# Patient Record
Sex: Female | Born: 1970 | Race: White | Hispanic: No | Marital: Married | Smoking: Never smoker
Health system: Southern US, Community
[De-identification: ages and names within clinical notes are randomized; demographics above are authoritative.]

## PROBLEM LIST (undated history)

## (undated) ENCOUNTER — Inpatient Hospital Stay (HOSPITAL_COMMUNITY): Payer: Self-pay

## (undated) DIAGNOSIS — D649 Anemia, unspecified: Secondary | ICD-10-CM

## (undated) DIAGNOSIS — F419 Anxiety disorder, unspecified: Secondary | ICD-10-CM

## (undated) DIAGNOSIS — R011 Cardiac murmur, unspecified: Secondary | ICD-10-CM

## (undated) HISTORY — PX: EXCISION OF BREAST BIOPSY: SHX5822

## (undated) HISTORY — DX: Anemia, unspecified: D64.9

## (undated) HISTORY — PX: BREAST EXCISIONAL BIOPSY: SUR124

---

## 2010-06-07 ENCOUNTER — Encounter: Admission: RE | Admit: 2010-06-07 | Discharge: 2010-06-07 | Payer: Self-pay | Admitting: Obstetrics and Gynecology

## 2011-01-20 ENCOUNTER — Ambulatory Visit (HOSPITAL_BASED_OUTPATIENT_CLINIC_OR_DEPARTMENT_OTHER)
Admission: RE | Admit: 2011-01-20 | Discharge: 2011-01-20 | Disposition: A | Discharge status: Home / Self Care | Payer: BC Managed Care – PPO | Source: Ambulatory Visit | Attending: Orthopedic Surgery | Admitting: Orthopedic Surgery

## 2011-01-20 DIAGNOSIS — T65891A Toxic effect of other specified substances, accidental (unintentional), initial encounter: Secondary | ICD-10-CM | POA: Insufficient documentation

## 2011-01-20 DIAGNOSIS — S61209A Unspecified open wound of unspecified finger without damage to nail, initial encounter: Secondary | ICD-10-CM | POA: Insufficient documentation

## 2011-01-20 DIAGNOSIS — W268XXA Contact with other sharp object(s), not elsewhere classified, initial encounter: Secondary | ICD-10-CM | POA: Insufficient documentation

## 2011-01-20 DIAGNOSIS — M65839 Other synovitis and tenosynovitis, unspecified forearm: Secondary | ICD-10-CM | POA: Insufficient documentation

## 2011-01-23 LAB — WOUND CULTURE

## 2011-01-24 NOTE — Op Note (Signed)
Lisa Carter, Lisa Carter            ACCOUNT NO.:  [id number]  MEDICAL RECORD NO.:  [id number]           PATIENT TYPE:  LOCATION:                                 FACILITY:  PHYSICIAN:  Katy Fitch. Janene Yousuf, M.D.      DATE OF BIRTH:  DATE OF PROCEDURE:  01/20/2011 DATE OF DISCHARGE:                              OPERATIVE REPORT   PREOPERATIVE DIAGNOSIS:  One week status post thorn impalement, left index finger distal interphalangeal flexion crease volar surface with development of inflammation and inability to flex left index finger without discomfort.  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  OPERATIONS: 1. Incision and debridement of left index finger flexor sheath and     distal interphalangeal flexion crease region. 2. Culture of flexor sheath tenosynovium fluid for aerobic and     anaerobic growth. 3. Placement of a Xeroflo wick drain at level of flexor sheath, left     index finger.  OPERATING SURGEON:  Katy Fitch. Lisa Leisner, MD  ASSISTANT:  Marveen Reeks. Dasnoit, PA-C  ANESTHESIA:  2% lidocaine metacarpal head level block, left index finger, total volume 3 mL.  This was performed as a minor operating room procedure.  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  While pushing back a grassy bush, she sustained some type of impalement of a thorn into the palmar surface of her left index finger at the DIP flexion crease.  This appeared to be a minor injury; however, over the course of the ensuing 7 days, she began to experience increasing rubor at her DIP flexion crease, discomfort with finger flexion and by today,  the inability to flex the finger beyond 85% of normal.  On the right side, she could flex her fingertip to the middle palmar crease.  On the left side, she lacked contact by the index finger to the distal palmar crease by 15 mm.  She was nontender on palpation over the flexor sheath proximally in the palm and over the proximal phalangeal segment, but was tender over the middle phalangeal segment and in the pulp.  She appeared to have maximum tenderness on the radial aspect of her DIP flexion crease and in the proximal pulp.  We were unable to clearly identified the type of bush she was exposed to; however, one is concerned that this could be a Pyracantha thorn, which can cause significant chemical inflammation or this could be the lead edge of a bacterial inflammatory/infectious tenosynovitis.  Lisa Carter contacted our office and requested evaluation.  We met her the [facility] and noted the aforementioned findings of loss of flexion and tenderness over the tenosynovial sheath.  We recommended exploration of her fingertip under local anesthesia.  She had had breakfast.  PROCEDURE:  Lisa Carter was brought to room #1 of the [facility] and placed in supine  position on the operating table.  She was noted have no drug allergies.  After informed consent in the holding area and with questions being invited and answered, the left hand was prepped with alcohol and Betadine and 3 mL of 2% lidocaine without epinephrine were infiltrated at metacarpal head level to obtain a digital block.  After 10 minutes, excellent anesthesia of the left index finger was achieved.  We subsequently prepped her left hand and forearm with Betadine soap solution and sterilely draped.  The left index finger was exsanguinated with a gauze wrap and a 0.5-inch Penrose drain was placed at the base of the finger as a digital tourniquet.  After routine surgical time-out, we performed a  Brunner zigzag incision with a longitudinal distal limb and a 45 degrees angled limb into the middle phalangeal segment of the finger.  The subcutaneous tissues were quite indurated with saponification of the subcutaneous fat.  We meticulously spread this region and noted a bulging flexor sheath.  The flexor sheath was incised with the tenotomy scissors and immediately approximately 1 mL of turbid fluid was expressed.  This represents either an early bacterial tenosynovitis or a chemical synovitis from a toxic thorn.  With great care, we spread the pulp and the radial ulnar aspects of the middle phalangeal segment of the finger. I was able to identify two fragments of thorn that were quite small suggesting a toxic response.  It appeared that we identified the tip of the thorn with its pointed end and a second fragment that appeared to a broken off from the main fragment.  After meticulous search, we could not identify any other fragments.  The wound was then irrigated with sterile saline with a syringe under pressure followed by packing the wound with Xeroflo wick and applying Xeroflo sterile gauze and the Coban finger dressing.  There were no apparent complications.  While there was a slight risk of an atypical mycobacterial infection, more likely not this represents a toxic thorn reaction.  Ciclaly will be covered with oral doxycycline 100 mg p.o. b.i.d. for broad-spectrum coverage of Gram-positive bacteria and partial coverage of atypical mycobacteria.  If she has persistent inflammation, we may need to switch to Biaxin or one of the other agents that are more appropriate for atypical mycobacteria.  She was provided Ultram 50 mg one p.o. q.4-6 h. p.r.n. pain 20 tablets as an analgesic and doxycycline 100 mg p.o. b.i.d. x7 days as a prophylactic antibiotic.  We will see her back for follow up in our office on Monday, January 23, 2011 for a followup evaluation.  Both she and her  husband Dr. Emelia Loron understand that I am on-call this weekend and will be happy to reevaluate her any time sooner p.r.n. problems.     Katy Fitch Lisa Carter, M.D.     RVS/MEDQ  D:  01/20/2011  T:  01/21/2011  Job:  [id number]  cc:   Juanetta Gosling, MD  Electronically Signed by Josephine Igo M.D. on 01/24/2011 02:21:15 PM

## 2011-06-07 ENCOUNTER — Other Ambulatory Visit: Payer: Self-pay | Admitting: Obstetrics and Gynecology

## 2011-06-07 DIAGNOSIS — Z1231 Encounter for screening mammogram for malignant neoplasm of breast: Secondary | ICD-10-CM

## 2011-06-07 DIAGNOSIS — R2230 Localized swelling, mass and lump, unspecified upper limb: Secondary | ICD-10-CM

## 2011-06-14 ENCOUNTER — Ambulatory Visit
Admission: RE | Admit: 2011-06-14 | Discharge: 2011-06-14 | Disposition: A | Discharge status: Home / Self Care | Payer: BC Managed Care – PPO | Source: Ambulatory Visit | Attending: Obstetrics and Gynecology | Admitting: Obstetrics and Gynecology

## 2011-06-14 DIAGNOSIS — R2230 Localized swelling, mass and lump, unspecified upper limb: Secondary | ICD-10-CM

## 2011-06-16 ENCOUNTER — Ambulatory Visit: Payer: BC Managed Care – PPO

## 2012-05-13 ENCOUNTER — Other Ambulatory Visit: Payer: Self-pay | Admitting: Obstetrics and Gynecology

## 2012-05-13 DIAGNOSIS — Z1231 Encounter for screening mammogram for malignant neoplasm of breast: Secondary | ICD-10-CM

## 2012-06-14 ENCOUNTER — Ambulatory Visit: Payer: BC Managed Care – PPO

## 2012-06-26 ENCOUNTER — Institutional Professional Consult (permissible substitution): Payer: BC Managed Care – PPO | Admitting: Cardiovascular Disease

## 2012-07-15 ENCOUNTER — Ambulatory Visit
Admission: RE | Admit: 2012-07-15 | Discharge: 2012-07-15 | Disposition: A | Discharge status: Home / Self Care | Payer: BC Managed Care – PPO | Source: Ambulatory Visit | Attending: Obstetrics and Gynecology | Admitting: Obstetrics and Gynecology

## 2012-07-15 DIAGNOSIS — Z1231 Encounter for screening mammogram for malignant neoplasm of breast: Secondary | ICD-10-CM

## 2013-06-24 ENCOUNTER — Inpatient Hospital Stay (HOSPITAL_COMMUNITY): Payer: BC Managed Care – PPO

## 2013-06-24 ENCOUNTER — Inpatient Hospital Stay (HOSPITAL_COMMUNITY)
Admission: AD | Admit: 2013-06-24 | Discharge: 2013-06-24 | Disposition: A | Discharge status: Home / Self Care | Payer: BC Managed Care – PPO | Source: Ambulatory Visit | Attending: Obstetrics & Gynecology | Admitting: Obstetrics & Gynecology

## 2013-06-24 ENCOUNTER — Encounter (HOSPITAL_COMMUNITY): Payer: Self-pay

## 2013-06-24 DIAGNOSIS — O34599 Maternal care for other abnormalities of gravid uterus, unspecified trimester: Secondary | ICD-10-CM

## 2013-06-24 DIAGNOSIS — O200 Threatened abortion: Secondary | ICD-10-CM

## 2013-06-24 DIAGNOSIS — R109 Unspecified abdominal pain: Secondary | ICD-10-CM | POA: Insufficient documentation

## 2013-06-24 DIAGNOSIS — N831 Corpus luteum cyst of ovary, unspecified side: Secondary | ICD-10-CM | POA: Insufficient documentation

## 2013-06-24 HISTORY — DX: Cardiac murmur, unspecified: R01.1

## 2013-06-24 HISTORY — DX: Anxiety disorder, unspecified: F41.9

## 2013-06-24 LAB — URINALYSIS, ROUTINE W REFLEX MICROSCOPIC
Bilirubin Urine: NEGATIVE
Glucose, UA: NEGATIVE mg/dL
Hgb urine dipstick: NEGATIVE
Ketones, ur: NEGATIVE mg/dL
Leukocytes, UA: NEGATIVE
Nitrite: NEGATIVE
Protein, ur: NEGATIVE mg/dL
Specific Gravity, Urine: <1.005 — ABNORMAL LOW (ref 1.005–1.030)
pH: 5.5 (ref 5.0–8.0)

## 2013-06-24 LAB — CBC
RBC: 3.51 MIL/uL — ABNORMAL LOW (ref 3.87–5.11)
WBC: 6.7 10*3/uL (ref 4.0–10.5)

## 2013-06-24 LAB — WET PREP, GENITAL

## 2013-06-24 LAB — HCG, QUANTITATIVE, PREGNANCY: hCG, Beta Chain, Quant, S: 716 m[IU]/mL — ABNORMAL HIGH (ref <5)

## 2013-06-24 NOTE — MAU Provider Note (Signed)
Chief Complaint: Vaginal Bleeding, Abdominal Cramping and Possible Pregnancy   First Provider Initiated Contact with Patient 06/24/13 2114     SUBJECTIVE HPI: Lisa Carter is a 42 y.o. G3P2002 at [redacted]w[redacted]d by LMP who presents with low abdominal cramping left greater than right x3 days and one episode of moderate red bleeding yesterday. Was due to start her period, but took UPT because of atypical bleeding and cramping. UPT positive. Has been taking oral contraceptives, but stopped 2 to flashes. The morning after pill approximately 2 days after ovulation. Denies fever, chills, passage of clots or tissue, nausea, vomiting, diarrhea, constipation, vaginal discharge, urinary complaints. Has not had any other testing this pregnancy.  Past Medical History  Diagnosis Date  . Heart murmur   . Anxiety     taking celexa   OB History  Gravida Para Term Preterm AB SAB TAB Ectopic Multiple Living  3 2 2       2     # Outcome Date GA Lbr Len/2nd Weight Sex Delivery Anes PTL Lv  3 CUR           2 TRM 07/04/05 [redacted]w[redacted]d    LTCS   Y     Comments: c.section d/t reconstructive surgery after 1st delivery  1 TRM 09/14/02 [redacted]w[redacted]d    SVD   Y     Past Surgical History  Procedure Laterality Date  . Cesarean section    . Breast lumpectomy     History   Social History  . Marital Status: Married    Spouse Name: N/A    Number of Children: N/A  . Years of Education: N/A   Occupational History  . Not on file.   Social History Main Topics  . Smoking status: Never Smoker   . Smokeless tobacco: Not on file  . Alcohol Use: Yes     Comment: socially  . Drug Use: No  . Sexual Activity: Yes    Birth Control/ Protection: None   Other Topics Concern  . Not on file   Social History Narrative  . No narrative on file   No current facility-administered medications on file prior to encounter.   No current outpatient prescriptions on file prior to encounter.   No Known Allergies  ROS: Pertinent items in  HPI  OBJECTIVE Blood pressure 122/74, pulse 85, temperature 99.2 F (37.3 C), temperature source Oral, resp. rate 18, height 5\' 2"  (1.575 m), weight 56.246 kg (124 lb), last menstrual period 05/24/2013. GENERAL: Well-developed, well-nourished female in no acute distress. Anxious. HEENT: Normocephalic HEART: normal rate RESP: normal effort ABDOMEN: Soft, non-tender. Mild diffuse tenderness. Negative rebound negative mass positive bowel sounds. EXTREMITIES: Nontender, no edema NEURO: Alert and oriented SPECULUM EXAM: NEFG, physiologic discharge, scant, tan blood noted, 1 cm ectropion. cervix by clean BIMANUAL: cervix close; uterus normal size, mild left adnexal tenderness. No mass. No right adnexal tenderness or mass. No cervical motion tenderness.  LAB RESULTS Results for orders placed during the hospital encounter of 06/24/13 (from the past 24 hour(s))  URINALYSIS, ROUTINE W REFLEX MICROSCOPIC     Status: Abnormal   Collection Time    06/24/13  8:48 PM      Result Value Range   Color, Urine YELLOW  YELLOW   APPearance CLEAR  CLEAR   Specific Gravity, Urine <1.005 (*) 1.005 - 1.030   pH 5.5  5.0 - 8.0   Glucose, UA NEGATIVE  NEGATIVE mg/dL   Hgb urine dipstick NEGATIVE  NEGATIVE   Bilirubin Urine NEGATIVE  NEGATIVE   Ketones, ur NEGATIVE  NEGATIVE mg/dL   Protein, ur NEGATIVE  NEGATIVE mg/dL   Urobilinogen, UA 0.2  0.0 - 1.0 mg/dL   Nitrite NEGATIVE  NEGATIVE   Leukocytes, UA NEGATIVE  NEGATIVE  POCT PREGNANCY, URINE     Status: Abnormal   Collection Time    06/24/13  8:55 PM      Result Value Range   Preg Test, Ur POSITIVE (*) NEGATIVE  ABO/RH     Status: None   Collection Time    06/24/13  9:20 PM      Result Value Range   ABO/RH(D) B POS    HCG, QUANTITATIVE, PREGNANCY     Status: Abnormal   Collection Time    06/24/13  9:20 PM      Result Value Range   hCG, Beta Chain, Quant, S 716 (*) <5 mIU/mL  CBC     Status: Abnormal   Collection Time    06/24/13  9:20 PM       Result Value Range   WBC 6.7  4.0 - 10.5 K/uL   RBC 3.51 (*) 3.87 - 5.11 MIL/uL   Hemoglobin 11.7 (*) 12.0 - 15.0 g/dL   HCT 45.4 (*) 09.8 - 11.9 %   MCV 96.6  78.0 - 100.0 fL   MCH 33.3  26.0 - 34.0 pg   MCHC 34.5  30.0 - 36.0 g/dL   RDW 14.7  82.9 - 56.2 %   Platelets 202  150 - 400 K/uL  WET PREP, GENITAL     Status: Abnormal   Collection Time    06/24/13 10:46 PM      Result Value Range   Yeast Wet Prep HPF POC NONE SEEN  NONE SEEN   Trich, Wet Prep NONE SEEN  NONE SEEN   Clue Cells Wet Prep HPF POC NONE SEEN  NONE SEEN   WBC, Wet Prep HPF POC MODERATE (*) NONE SEEN    IMAGING US Ob Comp Less 14 Wks  06/24/2013   *RADIOLOGY REPORT*  Clinical Data: Vaginal bleeding and cramping for 3 days.  Estimated gestational age by LMP is 4 weeks 3 days.  Quantitative beta HCG is 716.  OBSTETRIC <14 WK [place] AND TRANSVAGINAL OB US  Technique:  Both transabdominal and transvaginal ultrasound examinations were performed for complete evaluation of the gestation as well as the maternal uterus, adnexal regions, and pelvic cul-de-sac.  Transvaginal technique was performed to assess early pregnancy.  Comparison:  None.  Intrauterine gestational sac:  Not visualized Yolk sac: Not visualized Embryo: Not visualized Cardiac Activity: Not visualized  Maternal uterus/adnexae: The uterus is anteverted.  No myometrial masses.  Small Nabothian cysts in the cervix.  Endometrial stripe thickness is normal.  Both ovaries are visualized are symmetrical in appearance.  Flow is demonstrated in both ovaries on color flow Doppler imaging.  The left ovary measures 2.8 x 3.4 x 2.5 cm.  The right ovary measures 1.3 x 2.2 x 1.4 cm.  Suggestion of a small corpus luteum cyst on the left ovary.  No free pelvic fluid collections.  IMPRESSION: No intrauterine pregnancy is demonstrated. This could represent an early intrauterine pregnancy which is too small to see versus changes of recent spontaneous abortion.  An occult ectopic pregnancy  is not excluded.  Recommend follow-up with serial quantitative beta HCG studies and short-term ultrasound followup in 10 days.   Original Report Authenticated By: Burman Nieves, M.D.   US Ob Transvaginal  06/24/2013   *RADIOLOGY  REPORT*  Clinical Data: Vaginal bleeding and cramping for 3 days.  Estimated gestational age by LMP is 4 weeks 3 days.  Quantitative beta HCG is 716.  OBSTETRIC <14 WK [place] AND TRANSVAGINAL OB US  Technique:  Both transabdominal and transvaginal ultrasound examinations were performed for complete evaluation of the gestation as well as the maternal uterus, adnexal regions, and pelvic cul-de-sac.  Transvaginal technique was performed to assess early pregnancy.  Comparison:  None.  Intrauterine gestational sac:  Not visualized Yolk sac: Not visualized Embryo: Not visualized Cardiac Activity: Not visualized  Maternal uterus/adnexae: The uterus is anteverted.  No myometrial masses.  Small Nabothian cysts in the cervix.  Endometrial stripe thickness is normal.  Both ovaries are visualized are symmetrical in appearance.  Flow is demonstrated in both ovaries on color flow Doppler imaging.  The left ovary measures 2.8 x 3.4 x 2.5 cm.  The right ovary measures 1.3 x 2.2 x 1.4 cm.  Suggestion of a small corpus luteum cyst on the left ovary.  No free pelvic fluid collections.  IMPRESSION: No intrauterine pregnancy is demonstrated. This could represent an early intrauterine pregnancy which is too small to see versus changes of recent spontaneous abortion.  An occult ectopic pregnancy is not excluded.  Recommend follow-up with serial quantitative beta HCG studies and short-term ultrasound followup in 10 days.   Original Report Authenticated By: Burman Nieves, M.D.   MAU COURSE  ASSESSMENT 1. Threatened miscarriage in early pregnancy    2. left corpus luteum cyst.  PLAN Discharge home in stable condition per consult with Dr. Langston Masker. Ectopic and SAB precautions reviewed.     Follow-up  Information   Follow up with [facility], [place].. Schedule an appointment as soon as possible for a visit on 06/27/2013.   Contact information:   [address] [place] [address] [phone]      Follow up with [facility] [place] [facility] In 2 days. (for repeat blood work or sooner as needed if symptoms worsen)    Contact information:   [address] [id number] [place] [address] [phone]       Medication List         ALEVE 220 MG tablet  Generic drug:  naproxen sodium  Take 220 mg by mouth daily as needed (For headache.).     citalopram 20 MG tablet  Commonly known as:  CELEXA  Take 20 mg by mouth daily.     JUICE PLUS FIBRE PO  Take 4 capsules by mouth daily.       Dana, CNM 06/24/2013  11:06 PM

## 2013-06-24 NOTE — MAU Note (Signed)
Bright red bleeding & abdominal cramping x 3 days. States thought she was starting her period but had a positive UPT today at home.

## 2013-06-25 LAB — GC/CHLAMYDIA PROBE AMP: CT Probe RNA: NEGATIVE

## 2013-06-27 ENCOUNTER — Inpatient Hospital Stay (HOSPITAL_COMMUNITY): Payer: BC Managed Care – PPO

## 2013-06-27 ENCOUNTER — Encounter (HOSPITAL_COMMUNITY): Payer: Self-pay

## 2013-06-27 ENCOUNTER — Inpatient Hospital Stay (HOSPITAL_COMMUNITY)
Admission: AD | Admit: 2013-06-27 | Discharge: 2013-06-27 | Disposition: A | Discharge status: Home / Self Care | Payer: BC Managed Care – PPO | Source: Ambulatory Visit | Attending: Obstetrics and Gynecology | Admitting: Obstetrics and Gynecology

## 2013-06-27 DIAGNOSIS — R109 Unspecified abdominal pain: Secondary | ICD-10-CM

## 2013-06-27 DIAGNOSIS — O26899 Other specified pregnancy related conditions, unspecified trimester: Secondary | ICD-10-CM

## 2013-06-27 DIAGNOSIS — R1031 Right lower quadrant pain: Secondary | ICD-10-CM | POA: Insufficient documentation

## 2013-06-27 DIAGNOSIS — O99891 Other specified diseases and conditions complicating pregnancy: Secondary | ICD-10-CM | POA: Insufficient documentation

## 2013-06-27 NOTE — MAU Note (Addendum)
Pt reports having RLQ pain.Marland Kitchen Here on Tues with spotting. Not able to see anything on U/S. Pt stated the Right side is very tender to touch. No bleeding today. Reported her BHCG was 700 in MAU on Tuesday and 1800 at [facility] office.

## 2013-06-27 NOTE — MAU Provider Note (Signed)
History     CSN: [id number]  Arrival date and time: 06/27/13 1508   First Provider Initiated Contact with Patient 06/27/13 1845      Chief Complaint  Patient presents with  . Abdominal Pain   HPI Lisa Carter is 42 y.o. G3P2002 [redacted]w[redacted]d weeks presenting with left lower abdominal pain X 5 days. It became sharper today. Called the office and instructed to come here for re-evaluation.  Had contractions on Sunday and then on Monday, she had heavy bleeding.  She is a patient of Dr. Garlan Fillers.  She was seen here for same pain on 8/17.  BHCG was 716 and U/S did not see IUP.  She was followed in the office yesterday in the office.   States the BHCG was 1800.  24 hrs later she is here and BHCG is 2073.  She is very upset, crying.  This is an unplanned pregnancy.  She has pain med at home.   Past Medical History  Diagnosis Date  . Heart murmur   . Anxiety     taking celexa    Past Surgical History  Procedure Laterality Date  . Cesarean section    . Breast lumpectomy      History reviewed. No pertinent family history.  History  Substance Use Topics  . Smoking status: Never Smoker   . Smokeless tobacco: Not on file  . Alcohol Use: Yes     Comment: socially    Allergies: No Known Allergies  Prescriptions prior to admission  Medication Sig Dispense Refill  . citalopram (CELEXA) 20 MG tablet Take 20 mg by mouth daily.      . naproxen sodium (ALEVE) 220 MG tablet Take 220 mg by mouth daily as needed (For headache.).      Marland Kitchen Nutritional Supplements (JUICE PLUS FIBRE PO) Take 4 capsules by mouth daily.        Review of Systems  Constitutional: Negative for fever and chills.  Gastrointestinal: Positive for abdominal pain (right sided pain). Negative for nausea and vomiting.  Genitourinary:       Neg for vaginal bleeding or discharge   Physical Exam   Blood pressure 117/70, pulse 84, temperature 98.3 F (36.8 C), temperature source Oral, resp. rate 18, height 5\' 2"  (1.575 m),  weight 118 lb (53.524 kg), last menstrual period 05/24/2013.  Physical Exam  Constitutional: She is oriented to person, place, and time. She appears well-developed and well-nourished.  crying  Genitourinary:  Not indicated and patient prefers to defer exam  Neurological: She is alert and oriented to person, place, and time.  Skin: Skin is warm and dry.  Psychiatric: She has a normal mood and affect. Her behavior is normal.    Results for orders placed during the hospital encounter of 06/27/13 (from the past 24 hour(s))  HCG, QUANTITATIVE, PREGNANCY     Status: Abnormal   Collection Time    06/27/13  4:20 PM      Result Value Range   hCG, Beta Chain, Quant, S 2073 (*) <5 mIU/mL     MAU Course  Procedures  MDM 19:02- Reported MSE, labs to Dr. Arelia Sneddon.  Explained patient is not bleeding and she prefers not to have pelvic exam.  Ok with Dr. Arelia Sneddon.  Follow up with Dr. Garlan Fillers Monday   Assessment and Plan  A:  Right abdominal pain      Reassuring rise in BHCG  P:  Follow up with Dr. Renaldo Fiddler on Monday      Ectopic precautions given  to patient  Matt Holmes 06/27/2013, 6:47 PM

## 2013-07-21 LAB — OB RESULTS CONSOLE GC/CHLAMYDIA
Chlamydia: NEGATIVE
Gonorrhea: NEGATIVE

## 2013-07-21 LAB — OB RESULTS CONSOLE HEPATITIS B SURFACE ANTIGEN: HEP B S AG: NEGATIVE

## 2013-07-21 LAB — OB RESULTS CONSOLE RUBELLA ANTIBODY, IGM: RUBELLA: IMMUNE

## 2013-07-21 LAB — OB RESULTS CONSOLE HIV ANTIBODY (ROUTINE TESTING): HIV: NONREACTIVE

## 2013-12-03 ENCOUNTER — Ambulatory Visit: Payer: 59 | Attending: Obstetrics and Gynecology | Admitting: Physical Therapy

## 2013-12-03 DIAGNOSIS — M25559 Pain in unspecified hip: Secondary | ICD-10-CM | POA: Insufficient documentation

## 2013-12-03 DIAGNOSIS — IMO0001 Reserved for inherently not codable concepts without codable children: Secondary | ICD-10-CM | POA: Insufficient documentation

## 2013-12-03 DIAGNOSIS — R5381 Other malaise: Secondary | ICD-10-CM | POA: Insufficient documentation

## 2013-12-03 DIAGNOSIS — O9989 Other specified diseases and conditions complicating pregnancy, childbirth and the puerperium: Secondary | ICD-10-CM

## 2013-12-03 DIAGNOSIS — O99891 Other specified diseases and conditions complicating pregnancy: Secondary | ICD-10-CM | POA: Insufficient documentation

## 2013-12-09 ENCOUNTER — Ambulatory Visit: Payer: 59 | Attending: Obstetrics and Gynecology | Admitting: Physical Therapy

## 2013-12-09 DIAGNOSIS — IMO0001 Reserved for inherently not codable concepts without codable children: Secondary | ICD-10-CM | POA: Insufficient documentation

## 2013-12-09 DIAGNOSIS — M25559 Pain in unspecified hip: Secondary | ICD-10-CM | POA: Insufficient documentation

## 2013-12-09 DIAGNOSIS — O9989 Other specified diseases and conditions complicating pregnancy, childbirth and the puerperium: Secondary | ICD-10-CM

## 2013-12-09 DIAGNOSIS — O99891 Other specified diseases and conditions complicating pregnancy: Secondary | ICD-10-CM | POA: Insufficient documentation

## 2013-12-09 DIAGNOSIS — R5381 Other malaise: Secondary | ICD-10-CM | POA: Insufficient documentation

## 2013-12-12 ENCOUNTER — Ambulatory Visit: Payer: 59 | Admitting: Physical Therapy

## 2013-12-15 ENCOUNTER — Ambulatory Visit: Payer: 59 | Admitting: Physical Therapy

## 2013-12-17 ENCOUNTER — Ambulatory Visit: Payer: 59

## 2013-12-17 ENCOUNTER — Ambulatory Visit: Payer: 59 | Admitting: Physical Therapy

## 2013-12-23 ENCOUNTER — Ambulatory Visit: Payer: 59 | Admitting: Physical Therapy

## 2013-12-26 ENCOUNTER — Ambulatory Visit: Payer: 59 | Admitting: Physical Therapy

## 2013-12-29 ENCOUNTER — Ambulatory Visit: Payer: 59 | Admitting: Physical Therapy

## 2013-12-31 ENCOUNTER — Ambulatory Visit: Payer: 59 | Admitting: Physical Therapy

## 2014-01-06 ENCOUNTER — Ambulatory Visit: Payer: 59 | Attending: Obstetrics and Gynecology | Admitting: Physical Therapy

## 2014-01-06 DIAGNOSIS — R5381 Other malaise: Secondary | ICD-10-CM | POA: Insufficient documentation

## 2014-01-06 DIAGNOSIS — IMO0001 Reserved for inherently not codable concepts without codable children: Secondary | ICD-10-CM | POA: Insufficient documentation

## 2014-01-06 DIAGNOSIS — O9989 Other specified diseases and conditions complicating pregnancy, childbirth and the puerperium: Secondary | ICD-10-CM

## 2014-01-06 DIAGNOSIS — O99891 Other specified diseases and conditions complicating pregnancy: Secondary | ICD-10-CM | POA: Insufficient documentation

## 2014-01-06 DIAGNOSIS — M25559 Pain in unspecified hip: Secondary | ICD-10-CM | POA: Insufficient documentation

## 2014-01-09 ENCOUNTER — Ambulatory Visit: Payer: 59 | Admitting: Physical Therapy

## 2014-01-14 ENCOUNTER — Ambulatory Visit: Payer: 59 | Admitting: Physical Therapy

## 2014-01-15 ENCOUNTER — Ambulatory Visit: Payer: 59 | Admitting: Physical Therapy

## 2014-01-16 ENCOUNTER — Encounter: Payer: 59 | Admitting: Physical Therapy

## 2014-02-02 LAB — OB RESULTS CONSOLE GBS: STREP GROUP B AG: POSITIVE

## 2014-02-12 ENCOUNTER — Inpatient Hospital Stay (HOSPITAL_COMMUNITY)
Admission: AD | Admit: 2014-02-12 | Discharge: 2014-02-14 | DRG: 766 | Disposition: A | Discharge status: Home / Self Care | Payer: 59 | Source: Ambulatory Visit | Attending: Obstetrics and Gynecology | Admitting: Obstetrics and Gynecology

## 2014-02-12 ENCOUNTER — Encounter (HOSPITAL_COMMUNITY)
Admission: AD | Disposition: A | Discharge status: Home / Self Care | Payer: Self-pay | Source: Ambulatory Visit | Attending: Obstetrics and Gynecology

## 2014-02-12 ENCOUNTER — Inpatient Hospital Stay (HOSPITAL_COMMUNITY): Payer: 59 | Admitting: Anesthesiology

## 2014-02-12 ENCOUNTER — Encounter (HOSPITAL_COMMUNITY): Payer: 59 | Admitting: Anesthesiology

## 2014-02-12 ENCOUNTER — Encounter (HOSPITAL_COMMUNITY): Payer: Self-pay | Admitting: *Deleted

## 2014-02-12 DIAGNOSIS — Z302 Encounter for sterilization: Secondary | ICD-10-CM

## 2014-02-12 DIAGNOSIS — O99344 Other mental disorders complicating childbirth: Secondary | ICD-10-CM | POA: Diagnosis present

## 2014-02-12 DIAGNOSIS — F411 Generalized anxiety disorder: Secondary | ICD-10-CM | POA: Diagnosis present

## 2014-02-12 DIAGNOSIS — Z98891 History of uterine scar from previous surgery: Secondary | ICD-10-CM

## 2014-02-12 DIAGNOSIS — O34219 Maternal care for unspecified type scar from previous cesarean delivery: Principal | ICD-10-CM | POA: Diagnosis present

## 2014-02-12 DIAGNOSIS — R011 Cardiac murmur, unspecified: Secondary | ICD-10-CM | POA: Diagnosis present

## 2014-02-12 LAB — TYPE AND SCREEN
ABO/RH(D): B POS
ANTIBODY SCREEN: NEGATIVE

## 2014-02-12 LAB — CBC
HEMATOCRIT: 33.5 % — AB (ref 36.0–46.0)
HEMOGLOBIN: 11.7 g/dL — AB (ref 12.0–15.0)
MCH: 33.9 pg (ref 26.0–34.0)
MCHC: 34.9 g/dL (ref 30.0–36.0)
MCV: 97.1 fL (ref 78.0–100.0)
Platelets: 123 10*3/uL — ABNORMAL LOW (ref 150–400)
RBC: 3.45 MIL/uL — AB (ref 3.87–5.11)
RDW: 13.3 % (ref 11.5–15.5)
WBC: 7.1 10*3/uL (ref 4.0–10.5)

## 2014-02-12 LAB — AMNISURE RUPTURE OF MEMBRANE (ROM) NOT AT ARMC: AMNISURE: POSITIVE

## 2014-02-12 SURGERY — Surgical Case
Anesthesia: Spinal | Site: Abdomen

## 2014-02-12 MED ORDER — PROMETHAZINE HCL 25 MG/ML IJ SOLN: 6.2500 mg | INTRAMUSCULAR | Status: DC | PRN

## 2014-02-12 MED ORDER — FENTANYL CITRATE 0.05 MG/ML IJ SOLN
INTRAMUSCULAR | Status: DC | PRN
  Administered 2014-02-12: 10 ug via INTRATHECAL

## 2014-02-12 MED ORDER — PHENYLEPHRINE 8 MG IN D5W 100 ML (0.08MG/ML) PREMIX OPTIME
INJECTION | INTRAVENOUS | Status: AC
  Filled 2014-02-12: qty 100

## 2014-02-12 MED ORDER — PHENYLEPHRINE 8 MG IN D5W 100 ML (0.08MG/ML) PREMIX OPTIME
INJECTION | INTRAVENOUS | Status: DC | PRN
  Administered 2014-02-12: 60 ug/min via INTRAVENOUS

## 2014-02-12 MED ORDER — HYDROMORPHONE HCL PF 1 MG/ML IJ SOLN
0.2500 mg | INTRAMUSCULAR | Status: DC | PRN
  Administered 2014-02-12 (×4): 0.5 mg via INTRAVENOUS

## 2014-02-12 MED ORDER — MEPERIDINE HCL 25 MG/ML IJ SOLN
INTRAMUSCULAR | Status: DC | PRN
Start: 2014-02-12 — End: 2014-02-13
  Administered 2014-02-12 (×2): 12.5 mg via INTRAVENOUS

## 2014-02-12 MED ORDER — SCOPOLAMINE 1 MG/3DAYS TD PT72
MEDICATED_PATCH | TRANSDERMAL | Status: AC
Start: 2014-02-12 — End: 2014-02-13
  Filled 2014-02-12: qty 1

## 2014-02-12 MED ORDER — KETOROLAC TROMETHAMINE 30 MG/ML IJ SOLN
30.0000 mg | Freq: Four times a day (QID) | INTRAMUSCULAR | Status: AC | PRN
  Administered 2014-02-12 – 2014-02-13 (×2): 30 mg via INTRAVENOUS
  Filled 2014-02-12: qty 1

## 2014-02-12 MED ORDER — FENTANYL CITRATE 0.05 MG/ML IJ SOLN
INTRAMUSCULAR | Status: AC
  Filled 2014-02-12: qty 2

## 2014-02-12 MED ORDER — OXYTOCIN 10 UNIT/ML IJ SOLN
40.0000 [IU] | INTRAVENOUS | Status: DC | PRN
  Administered 2014-02-12: 40 [IU] via INTRAVENOUS

## 2014-02-12 MED ORDER — LACTATED RINGERS IV SOLN
INTRAVENOUS | Status: DC
  Administered 2014-02-12 (×3): via INTRAVENOUS

## 2014-02-12 MED ORDER — HYDROMORPHONE HCL PF 1 MG/ML IJ SOLN
INTRAMUSCULAR | Status: AC
  Filled 2014-02-12: qty 1

## 2014-02-12 MED ORDER — DIPHENHYDRAMINE HCL 50 MG/ML IJ SOLN
INTRAMUSCULAR | Status: AC
  Administered 2014-02-12: 12.5 mg
  Filled 2014-02-12: qty 1

## 2014-02-12 MED ORDER — MEPERIDINE HCL 25 MG/ML IJ SOLN
INTRAMUSCULAR | Status: AC
  Filled 2014-02-12: qty 1

## 2014-02-12 MED ORDER — MEPERIDINE HCL 25 MG/ML IJ SOLN: 6.2500 mg | INTRAMUSCULAR | Status: DC | PRN

## 2014-02-12 MED ORDER — FAMOTIDINE IN NACL 20-0.9 MG/50ML-% IV SOLN
20.0000 mg | Freq: Once | INTRAVENOUS | Status: AC
  Administered 2014-02-12: 20 mg via INTRAVENOUS
  Filled 2014-02-12: qty 50

## 2014-02-12 MED ORDER — SCOPOLAMINE 1 MG/3DAYS TD PT72
1.0000 | MEDICATED_PATCH | Freq: Once | TRANSDERMAL | Status: DC
  Administered 2014-02-12: 1.5 mg via TRANSDERMAL

## 2014-02-12 MED ORDER — NALBUPHINE HCL 10 MG/ML IJ SOLN
INTRAMUSCULAR | Status: AC
  Administered 2014-02-12: 10 mg via INTRAVENOUS
  Filled 2014-02-12: qty 1

## 2014-02-12 MED ORDER — KETOROLAC TROMETHAMINE 30 MG/ML IJ SOLN: 30.0000 mg | Freq: Four times a day (QID) | INTRAMUSCULAR | Status: AC | PRN

## 2014-02-12 MED ORDER — KETOROLAC TROMETHAMINE 30 MG/ML IJ SOLN
INTRAMUSCULAR | Status: AC
  Administered 2014-02-13: 30 mg via INTRAVENOUS
  Filled 2014-02-12: qty 1

## 2014-02-12 MED ORDER — ONDANSETRON HCL 4 MG/2ML IJ SOLN
INTRAMUSCULAR | Status: DC | PRN
  Administered 2014-02-12: 4 mg via INTRAVENOUS

## 2014-02-12 MED ORDER — MORPHINE SULFATE (PF) 0.5 MG/ML IJ SOLN
INTRAMUSCULAR | Status: DC | PRN
  Administered 2014-02-12: .2 mg via INTRATHECAL

## 2014-02-12 MED ORDER — OXYTOCIN 10 UNIT/ML IJ SOLN
INTRAMUSCULAR | Status: AC
  Filled 2014-02-12: qty 4

## 2014-02-12 MED ORDER — MORPHINE SULFATE 0.5 MG/ML IJ SOLN
INTRAMUSCULAR | Status: AC
  Filled 2014-02-12: qty 10

## 2014-02-12 MED ORDER — FENTANYL CITRATE 0.05 MG/ML IJ SOLN
50.0000 ug | INTRAMUSCULAR | Status: DC | PRN
  Administered 2014-02-12 – 2014-02-13 (×10): 50 ug via INTRAVENOUS

## 2014-02-12 MED ORDER — ONDANSETRON HCL 4 MG/2ML IJ SOLN
INTRAMUSCULAR | Status: AC
  Filled 2014-02-12: qty 2

## 2014-02-12 MED ORDER — BUPIVACAINE IN DEXTROSE 0.75-8.25 % IT SOLN
INTRATHECAL | Status: DC | PRN
  Administered 2014-02-12: 1.2 mL via INTRATHECAL

## 2014-02-12 MED ORDER — 0.9 % SODIUM CHLORIDE (POUR BTL) OPTIME
TOPICAL | Status: DC | PRN
  Administered 2014-02-12: 1000 mL

## 2014-02-12 MED ORDER — LACTATED RINGERS IV SOLN
INTRAVENOUS | Status: DC | PRN
  Administered 2014-02-12: 22:00:00 via INTRAVENOUS

## 2014-02-12 MED ORDER — FENTANYL CITRATE 0.05 MG/ML IJ SOLN
INTRAMUSCULAR | Status: DC | PRN
  Administered 2014-02-12: 50 ug via INTRAVENOUS

## 2014-02-12 MED ORDER — CEFAZOLIN SODIUM-DEXTROSE 2-3 GM-% IV SOLR
2.0000 g | Freq: Once | INTRAVENOUS | Status: AC
  Administered 2014-02-12: 2 g via INTRAVENOUS
  Filled 2014-02-12: qty 50

## 2014-02-12 MED ORDER — KETOROLAC TROMETHAMINE 30 MG/ML IJ SOLN: 15.0000 mg | Freq: Once | INTRAMUSCULAR | Status: AC | PRN

## 2014-02-12 MED ORDER — CITRIC ACID-SODIUM CITRATE 334-500 MG/5ML PO SOLN
30.0000 mL | Freq: Once | ORAL | Status: AC
  Administered 2014-02-12: 30 mL via ORAL
  Filled 2014-02-12: qty 15

## 2014-02-12 SURGICAL SUPPLY — 32 items
BLADE SURG CLIPPER 3M 9600 (MISCELLANEOUS) ×3 IMPLANT
CLAMP CORD UMBIL (MISCELLANEOUS) ×3 IMPLANT
CLEANER TIP ELECTROSURG 2X2 (MISCELLANEOUS) ×3 IMPLANT
CLOSURE WOUND 1/2 X4 (GAUZE/BANDAGES/DRESSINGS)
CLOTH BEACON ORANGE TIMEOUT ST (SAFETY) ×3 IMPLANT
DRAPE LG THREE QUARTER DISP (DRAPES) ×3 IMPLANT
DRSG OPSITE POSTOP 4X10 (GAUZE/BANDAGES/DRESSINGS) ×3 IMPLANT
DURAPREP 26ML APPLICATOR (WOUND CARE) ×3 IMPLANT
ELECT REM PT RETURN 9FT ADLT (ELECTROSURGICAL) ×3
ELECTRODE REM PT RTRN 9FT ADLT (ELECTROSURGICAL) ×1 IMPLANT
EXTRACTOR VACUUM M CUP 4 TUBE (SUCTIONS) IMPLANT
EXTRACTOR VACUUM M CUP 4' TUBE (SUCTIONS)
GLOVE BIO SURGEON STRL SZ7 (GLOVE) ×3 IMPLANT
GOWN STRL REUS W/TWL LRG LVL3 (GOWN DISPOSABLE) ×6 IMPLANT
KIT ABG SYR 3ML LUER SLIP (SYRINGE) IMPLANT
NEEDLE HYPO 25X5/8 SAFETYGLIDE (NEEDLE) IMPLANT
NS IRRIG 1000ML POUR BTL (IV SOLUTION) ×3 IMPLANT
PACK C SECTION WH (CUSTOM PROCEDURE TRAY) ×3 IMPLANT
PAD ABD 7.5X8 STRL (GAUZE/BANDAGES/DRESSINGS) ×3 IMPLANT
PAD OB MATERNITY 4.3X12.25 (PERSONAL CARE ITEMS) ×3 IMPLANT
PENCIL BUTTON HOLSTER BLD 10FT (ELECTRODE) ×3 IMPLANT
SLEEVE SCD COMPRESS KNEE MED (MISCELLANEOUS) ×3 IMPLANT
STRIP CLOSURE SKIN 1/2X4 (GAUZE/BANDAGES/DRESSINGS) IMPLANT
SUT CHROMIC 0 CTX 36 (SUTURE) ×12 IMPLANT
SUT MON AB 4-0 PS1 27 (SUTURE) ×3 IMPLANT
SUT PDS AB 0 CT1 27 (SUTURE) ×6 IMPLANT
SUT VIC AB 3-0 CT1 27 (SUTURE) ×4
SUT VIC AB 3-0 CT1 TAPERPNT 27 (SUTURE) ×2 IMPLANT
SYR BULB 3OZ (MISCELLANEOUS) ×3 IMPLANT
TOWEL OR 17X24 6PK STRL BLUE (TOWEL DISPOSABLE) ×3 IMPLANT
TRAY FOLEY CATH 14FR (SET/KITS/TRAYS/PACK) ×3 IMPLANT
WATER STERILE IRR 1000ML POUR (IV SOLUTION) IMPLANT

## 2014-02-12 NOTE — MAU Note (Addendum)
Noted wetness in underwear and some mucous, no further wetness. Was 2-3 when last checked.  scheduled c/s 04/20.

## 2014-02-12 NOTE — MAU Provider Note (Signed)
Tempie DonningRhonda Gaudin is a [id number] @ 2527w5d gestation who presents to MAU with ? ROM. She is scheduled for a C/S and was told if she started leaking fluid to come to MAU. Today she has had increased vaginal fluid.  SSE Procedures: Slide obtained for RN to do microscopic exam for ferning. She will call the patient's OB with results of slide and to give update on EFM tracing. Patient stable to await further orders from her OB.

## 2014-02-12 NOTE — Anesthesia Postprocedure Evaluation (Deleted)
Anesthesia Post Note  Patient: Lisa DonningRhonda Carter  Procedure(s) Performed: Procedure(s) (LRB): Repeat Cesarean Section Delivery Baby Boy@ 2139, Apgars 9/9 (N/A)  Anesthesia type: Spinal  Patient location: PACU  Post pain: Pain level controlled  Post assessment: Post-op Vital signs reviewed  Last Vitals:  Filed Vitals:   02/12/14 1839  BP: 123/69  Pulse: 89  Temp: 37.2 C  Resp: 18    Post vital signs: Reviewed  Level of consciousness: awake  Complications: No apparent anesthesia complications

## 2014-02-12 NOTE — Transfer of Care (Signed)
Immediate Anesthesia Transfer of Care Note  Patient: Lisa Carter  Procedure(s) Performed: Procedure(s): Repeat Cesarean Section Delivery Baby Boy@ 2139, Apgars 9/9 (N/A)  Patient Location: PACU  Anesthesia Type:Spinal  Level of Consciousness: awake  Airway & Oxygen Therapy: Patient Spontanous Breathing  Post-op Assessment: Report given to PACU RN  Post vital signs: Reviewed and stable  Complications: No apparent anesthesia complications

## 2014-02-12 NOTE — Anesthesia Preprocedure Evaluation (Signed)
Anesthesia Evaluation  Patient identified by MRN, date of birth, ID band Patient awake    Reviewed: Allergy & Precautions, H&P , NPO status , Patient's Chart, lab work & pertinent test results  Airway Mallampati: I TM Distance: >3 FB Neck ROM: full    Dental no notable dental hx.    Pulmonary neg pulmonary ROS,    Pulmonary exam normal       Cardiovascular negative cardio ROS      Neuro/Psych negative neurological ROS     GI/Hepatic negative GI ROS, Neg liver ROS,   Endo/Other  negative endocrine ROS  Renal/GU negative Renal ROS     Musculoskeletal   Abdominal Normal abdominal exam  (+)   Peds  Hematology negative hematology ROS (+)   Anesthesia Other Findings   Reproductive/Obstetrics (+) Pregnancy                           Anesthesia Physical Anesthesia Plan  ASA: II and emergent  Anesthesia Plan: Spinal   Post-op Pain Management:    Induction:   Airway Management Planned:   Additional Equipment:   Intra-op Plan:   Post-operative Plan:   Informed Consent: I have reviewed the patients History and Physical, chart, labs and discussed the procedure including the risks, benefits and alternatives for the proposed anesthesia with the patient or authorized representative who has indicated his/her understanding and acceptance.     Plan Discussed with: CRNA and Surgeon  Anesthesia Plan Comments:         Anesthesia Quick Evaluation  

## 2014-02-12 NOTE — Anesthesia Postprocedure Evaluation (Signed)
Anesthesia Post Note  Patient: Lisa DonningRhonda Saavedra  Procedure(s) Performed: Procedure(s) (LRB): Repeat Cesarean Section Delivery Baby Boy@ 2139, Apgars 9/9 (N/A)  Anesthesia type: Spinal  Patient location: PACU  Post pain: Pain level controlled  Post assessment: Post-op Vital signs reviewed  Last Vitals:  Filed Vitals:   02/12/14 2221  BP: 87/48  Pulse: 78  Temp: 36.6 C  Resp: 24    Post vital signs: Reviewed  Level of consciousness: awake  Complications: No apparent anesthesia complications

## 2014-02-12 NOTE — Anesthesia Procedure Notes (Signed)
Spinal  Patient location during procedure: OR Start time: 02/12/2014 9:15 PM End time: 02/12/2014 9:17 PM Staffing Anesthesiologist: Leilani AbleHATCHETT, Sylvester Salonga Performed by: anesthesiologist  Preanesthetic Checklist Completed: patient identified, surgical consent, pre-op evaluation, timeout performed, IV checked, risks and benefits discussed and monitors and equipment checked Spinal Block Patient position: sitting Prep: DuraPrep Patient monitoring: heart rate, cardiac monitor, continuous pulse ox and blood pressure Approach: midline Location: L3-4 Injection technique: single-shot Needle Needle type: Pencan  Needle gauge: 24 G Needle length: 9 cm Needle insertion depth: 5 cm Assessment Sensory level: T4

## 2014-02-12 NOTE — Op Note (Signed)
Preoperative diagnosis: 37+ week IUP, previous cesarean section, rupture of membranes with labor, request permanent sterilization  Postoperative diagnosis: Same  Procedure: Repeat low transverse cesarean section, tubal ligation by Filshie clip application  Surgeon: Marcelle OverlieHolland  Anesthesia: Spinal  EBL: 700 cc  Drains: Foley, to straight drain  Procedure and findings:  The patient taken the operating room after an adequate level of spinal anesthetic was obtained with the patient in left tilt position the abdomen prepped and draped in the usual fashion, Foley catheter positioned. Appropriate timeout taken at that point. Transverse incision made tibial scar which is well-healed this is carried down to the fascia which was incised and extended transversely. Rectus muscles divided in the midline, peritoneum entered superiorly without incident and extended in a vertical fashion. A minimal bladder flap was created, bladder blade was repositioned, transverse incision made in the lower segment extended with blunt dissection clear fluid noted the paced and then delivered of a healthy female from the vertex presentation the infant was suctioned cord clamped and passed the pediatric team for further care. The placenta was then delivered manually intact, uterus exteriorized cavity wiped clean with laparotomy pack closure obtained the first layer of 0 chromic in a locked fashion followed by an imbricating layer of 0 chromic. This is hemostatic bilateral tubes and ovaries were normal. The bladder flap area was intact and hemostatic. Filshie clip was then applied to each tube 2 cm from the cornu at a right ankle completely engulfing the tube with excellent application on each side. Prior to closure sponge, needle, instrument counts reported as correct x2. Peritoneum was enclose a running 3-0 Vicryl suture. 3-0 Vicryl interrupted sutures were then used to reapproximate the rectus muscles in the midline. 0 PDS and you should  laterally to midline on either side to close the fascia subcutaneous tissue was undermined to reduce tension, this was irrigated and made hemostatic with the Bovie. This was fairly thin was not closed separately 4-0 Monocryl subcuticular closure with pressure dressing she tolerated this well went to recovery room in good condition. Clear urine noted in the case  Dictated with [facility]  Lisa Carter M. Milana ObeyHolland M.D.

## 2014-02-12 NOTE — H&P (Signed)
Lisa DonningRhonda Carter is a 43 y.o. female presenting for SROM + labor. Maternal Medical History:  Reason for admission: Rupture of membranes and contractions.   Contractions: Onset was 3-5 hours ago.   Frequency: regular.   Perceived severity is moderate.    Fetal activity: Perceived fetal activity is normal.   Last perceived fetal movement was within the past hour.      OB History   Grav Para Term Preterm Abortions TAB SAB Ect Mult Living   3 2 2       2      Past Medical History  Diagnosis Date  . Heart murmur   . Anxiety     taking celexa   Past Surgical History  Procedure Laterality Date  . Cesarean section    . Breast lumpectomy     Family History: family history is not on file. Social History:  reports that she has never smoked. She does not have any smokeless tobacco history on file. She reports that she drinks alcohol. She reports that she does not use illicit drugs.   Prenatal Transfer Tool  Maternal Diabetes: No Genetic Screening: Normal Maternal Ultrasounds/Referrals: Normal Fetal Ultrasounds or other Referrals:  None Maternal Substance Abuse:  No Significant Maternal Medications:  None Significant Maternal Lab Results:  None Other Comments:  None  ROS  Dilation: 2.5 Effacement (%): 70 Station: -2 Exam by:: foley,rn Blood pressure 123/69, pulse 89, temperature 99 F (37.2 C), temperature source Oral, resp. rate 18, height 5\' 2"  (1.575 m), weight 148 lb (67.132 kg), last menstrual period 05/24/2013. Maternal Exam:  Uterine Assessment: Contraction strength is moderate.  Contraction frequency is regular.   Abdomen: Patient reports no abdominal tenderness. Surgical scars: low transverse.   Fundal height is term FH.   Estimated fetal weight is AGA.   Fetal presentation: vertex  Introitus: Normal vulva. Normal vagina.  Ferning test: negative.  Nitrazine test: negative. Amniotic fluid character: clear.  Pelvis: adequate for delivery.   Cervix: Cervix  evaluated by sterile speculum exam and digital exam.     Physical Exam  Constitutional: She is oriented to person, place, and time. She appears well-developed and well-nourished.  HENT:  Head: Normocephalic and atraumatic.  Neck: Normal range of motion. Neck supple.  Cardiovascular: Normal rate and regular rhythm.   Respiratory: Effort normal and breath sounds normal.  GI:  Term FH  FHR 148  Genitourinary:  Fern NEG but AMNISURE POST  Musculoskeletal: Normal range of motion.  Neurological: She is alert and oriented to person, place, and time.    Prenatal labs: ABO, Rh: --/--/B POS (08/19 2120) Antibody:   Rubella:   RPR:    HBsAg:    HIV:    GBS:   POST  Assessment/Plan: 6710w5d  SROM + labor, previous CS, sche for RCS + BTL....BTL permanence + failure rate 2-01/999 discussed IV Ancef  Lisa Carter 02/12/2014, 8:31 PM

## 2014-02-13 LAB — CBC
HEMATOCRIT: 28.6 % — AB (ref 36.0–46.0)
Hemoglobin: 9.8 g/dL — ABNORMAL LOW (ref 12.0–15.0)
MCH: 33.3 pg (ref 26.0–34.0)
MCHC: 34.3 g/dL (ref 30.0–36.0)
MCV: 97.3 fL (ref 78.0–100.0)
Platelets: 96 10*3/uL — ABNORMAL LOW (ref 150–400)
RBC: 2.94 MIL/uL — ABNORMAL LOW (ref 3.87–5.11)
RDW: 13.2 % (ref 11.5–15.5)
WBC: 9.2 10*3/uL (ref 4.0–10.5)

## 2014-02-13 LAB — RPR

## 2014-02-13 MED ORDER — LANOLIN HYDROUS EX OINT: 1.0000 "application " | TOPICAL_OINTMENT | CUTANEOUS | Status: DC | PRN

## 2014-02-13 MED ORDER — SODIUM CHLORIDE 0.9 % IJ SOLN: 3.0000 mL | INTRAMUSCULAR | Status: DC | PRN

## 2014-02-13 MED ORDER — LACTATED RINGERS IV SOLN
INTRAVENOUS | Status: DC
  Administered 2014-02-13: 07:00:00 via INTRAVENOUS

## 2014-02-13 MED ORDER — ZOLPIDEM TARTRATE 5 MG PO TABS: 5.0000 mg | ORAL_TABLET | Freq: Every evening | ORAL | Status: DC | PRN

## 2014-02-13 MED ORDER — DEXTROSE 5 % IV SOLN
1.0000 ug/kg/h | INTRAVENOUS | Status: DC | PRN
  Filled 2014-02-13: qty 2

## 2014-02-13 MED ORDER — DIPHENHYDRAMINE HCL 50 MG/ML IJ SOLN: 25.0000 mg | INTRAMUSCULAR | Status: DC | PRN

## 2014-02-13 MED ORDER — DIPHENHYDRAMINE HCL 25 MG PO CAPS
25.0000 mg | ORAL_CAPSULE | ORAL | Status: DC | PRN
  Administered 2014-02-13 – 2014-02-14 (×2): 25 mg via ORAL
  Filled 2014-02-13: qty 1

## 2014-02-13 MED ORDER — OXYCODONE-ACETAMINOPHEN 5-325 MG PO TABS
1.0000 | ORAL_TABLET | Freq: Four times a day (QID) | ORAL | Status: DC | PRN
  Administered 2014-02-13 – 2014-02-14 (×3): 1 via ORAL
  Filled 2014-02-13 (×3): qty 1

## 2014-02-13 MED ORDER — ONDANSETRON HCL 4 MG/2ML IJ SOLN: 4.0000 mg | INTRAMUSCULAR | Status: DC | PRN

## 2014-02-13 MED ORDER — METOCLOPRAMIDE HCL 5 MG/ML IJ SOLN: 10.0000 mg | Freq: Three times a day (TID) | INTRAMUSCULAR | Status: DC | PRN

## 2014-02-13 MED ORDER — FENTANYL CITRATE 0.05 MG/ML IJ SOLN
INTRAMUSCULAR | Status: AC
  Filled 2014-02-13: qty 2

## 2014-02-13 MED ORDER — ONDANSETRON HCL 4 MG PO TABS: 4.0000 mg | ORAL_TABLET | ORAL | Status: DC | PRN

## 2014-02-13 MED ORDER — PRENATAL MULTIVITAMIN CH
1.0000 | ORAL_TABLET | Freq: Every day | ORAL | Status: DC
Start: 2014-02-13 — End: 2014-02-14
  Administered 2014-02-14: 1 via ORAL
  Filled 2014-02-13: qty 1

## 2014-02-13 MED ORDER — DIPHENHYDRAMINE HCL 25 MG PO CAPS
25.0000 mg | ORAL_CAPSULE | Freq: Four times a day (QID) | ORAL | Status: DC | PRN
  Filled 2014-02-13: qty 1

## 2014-02-13 MED ORDER — SIMETHICONE 80 MG PO CHEW
80.0000 mg | CHEWABLE_TABLET | ORAL | Status: DC
  Administered 2014-02-14: 80 mg via ORAL
  Filled 2014-02-13: qty 1

## 2014-02-13 MED ORDER — BISACODYL 10 MG RE SUPP: 10.0000 mg | Freq: Every day | RECTAL | Status: DC | PRN

## 2014-02-13 MED ORDER — NALBUPHINE HCL 10 MG/ML IJ SOLN
5.0000 mg | INTRAMUSCULAR | Status: DC | PRN
  Administered 2014-02-13 (×2): 10 mg via SUBCUTANEOUS
  Filled 2014-02-13 (×2): qty 1

## 2014-02-13 MED ORDER — IBUPROFEN 800 MG PO TABS: 800.0000 mg | ORAL_TABLET | Freq: Three times a day (TID) | ORAL | Status: DC | PRN

## 2014-02-13 MED ORDER — SODIUM CHLORIDE 0.9 % IJ SOLN: 3.0000 mL | Freq: Two times a day (BID) | INTRAMUSCULAR | Status: DC

## 2014-02-13 MED ORDER — DIPHENHYDRAMINE HCL 50 MG/ML IJ SOLN: 12.5000 mg | INTRAMUSCULAR | Status: DC | PRN

## 2014-02-13 MED ORDER — SENNOSIDES-DOCUSATE SODIUM 8.6-50 MG PO TABS
2.0000 | ORAL_TABLET | ORAL | Status: DC
  Administered 2014-02-14: 2 via ORAL
  Filled 2014-02-13: qty 2

## 2014-02-13 MED ORDER — MEASLES, MUMPS & RUBELLA VAC ~~LOC~~ INJ
0.5000 mL | INJECTION | Freq: Once | SUBCUTANEOUS | Status: DC
  Filled 2014-02-13: qty 0.5

## 2014-02-13 MED ORDER — DIBUCAINE 1 % RE OINT: 1.0000 "application " | TOPICAL_OINTMENT | RECTAL | Status: DC | PRN

## 2014-02-13 MED ORDER — IBUPROFEN 600 MG PO TABS
600.0000 mg | ORAL_TABLET | Freq: Four times a day (QID) | ORAL | Status: DC | PRN
  Administered 2014-02-13 – 2014-02-14 (×2): 600 mg via ORAL
  Filled 2014-02-13 (×2): qty 1

## 2014-02-13 MED ORDER — SIMETHICONE 80 MG PO CHEW: 80.0000 mg | CHEWABLE_TABLET | ORAL | Status: DC | PRN

## 2014-02-13 MED ORDER — NALOXONE HCL 0.4 MG/ML IJ SOLN: 0.4000 mg | INTRAMUSCULAR | Status: DC | PRN

## 2014-02-13 MED ORDER — NALBUPHINE HCL 10 MG/ML IJ SOLN
5.0000 mg | INTRAMUSCULAR | Status: DC | PRN
  Administered 2014-02-12 – 2014-02-13 (×2): 10 mg via INTRAVENOUS
  Filled 2014-02-13: qty 1

## 2014-02-13 MED ORDER — CITALOPRAM HYDROBROMIDE 10 MG PO TABS
10.0000 mg | ORAL_TABLET | Freq: Every day | ORAL | Status: DC
  Administered 2014-02-14: 10 mg via ORAL
  Filled 2014-02-13 (×2): qty 1

## 2014-02-13 MED ORDER — SIMETHICONE 80 MG PO CHEW
80.0000 mg | CHEWABLE_TABLET | Freq: Three times a day (TID) | ORAL | Status: DC
  Administered 2014-02-13 – 2014-02-14 (×4): 80 mg via ORAL
  Filled 2014-02-13 (×4): qty 1

## 2014-02-13 MED ORDER — FENTANYL CITRATE 0.05 MG/ML IJ SOLN
INTRAMUSCULAR | Status: AC
  Administered 2014-02-13: 100 ug
  Filled 2014-02-13: qty 2

## 2014-02-13 MED ORDER — ONDANSETRON HCL 4 MG/2ML IJ SOLN: 4.0000 mg | Freq: Three times a day (TID) | INTRAMUSCULAR | Status: DC | PRN

## 2014-02-13 MED ORDER — WITCH HAZEL-GLYCERIN EX PADS: 1.0000 "application " | MEDICATED_PAD | CUTANEOUS | Status: DC | PRN

## 2014-02-13 MED ORDER — OXYTOCIN 40 UNITS IN LACTATED RINGERS INFUSION - SIMPLE MED: 62.5000 mL/h | INTRAVENOUS | Status: AC

## 2014-02-13 MED ORDER — FLEET ENEMA 7-19 GM/118ML RE ENEM: 1.0000 | ENEMA | Freq: Every day | RECTAL | Status: DC | PRN

## 2014-02-13 MED ORDER — SODIUM CHLORIDE 0.9 % IV SOLN: 250.0000 mL | INTRAVENOUS | Status: DC

## 2014-02-13 MED ORDER — MENTHOL 3 MG MT LOZG: 1.0000 | LOZENGE | OROMUCOSAL | Status: DC | PRN

## 2014-02-13 MED ORDER — TETANUS-DIPHTH-ACELL PERTUSSIS 5-2.5-18.5 LF-MCG/0.5 IM SUSP: 0.5000 mL | Freq: Once | INTRAMUSCULAR | Status: DC

## 2014-02-13 NOTE — Lactation Note (Signed)
This note was copied from the chart of Lisa Tempie DonningRhonda Zellars. Lactation Consultation Note Mom sleeping earlier, went back and was BF baby in Football position. Latched well denies pain. Noted occasional swallows.  Patient Name: Lisa Carter brochure given w/resources, support groups and LC services. Encouraged to call for assistance if needed and to verify proper latch. No issues at this time. Unable to BF 1st child, BF 2nd child for a couple of weeks w/low milk supply. Very please to see lots of colostrum and baby feeding so well. Today's Date: 02/13/2014 Reason for consult: Initial assessment   Maternal Data Does the patient have breastfeeding experience prior to this delivery?: Yes  Feeding Feeding Type: Breast Fed Length of feed: 15 min  LATCH Score/Interventions Latch: Grasps breast easily, tongue down, lips flanged, rhythmical sucking.  Audible Swallowing: A few with stimulation Intervention(s): Skin to skin  Type of Nipple: Everted at rest and after stimulation  Comfort (Breast/Nipple): Soft / non-tender     Hold (Positioning): No assistance needed to correctly position infant at breast.  LATCH Score: 9  Lactation Tools Discussed/Used Date initiated:: 02/13/14   Consult Status Consult Status: Follow-up    Lisa Carter 02/13/2014, 4:40 AM

## 2014-02-13 NOTE — Progress Notes (Signed)
Subjective: Postpartum Day 1: Cesarean Delivery Patient reports tolerating PO and + flatus.    Objective: Vital signs in last 24 hours: Temp:  [97.8 F (36.6 C)-99 F (37.2 C)] 98.5 F (36.9 C) (04/10 16100633) Pulse Rate:  [57-89] 57 (04/10 0633) Resp:  [15-24] 16 (04/10 0633) BP: (87-126)/(48-78) 92/58 mmHg (04/10 0633) SpO2:  [94 %-100 %] 97 % (04/10 96040633) Weight:  [148 lb (67.132 kg)-149 lb (67.586 kg)] 148 lb (67.132 kg) (04/09 1856)  Physical Exam:  General: alert and cooperative Lochia: appropriate Uterine Fundus: firm Incision: healing well DVT Evaluation: No evidence of DVT seen on physical exam. Negative Homan's sign. No cords or calf tenderness. No significant calf/ankle edema.   Recent Labs  02/12/14 2020 02/13/14 0649  HGB 11.7* 9.8*  HCT 33.5* 28.6*    Assessment/Plan: Status post Cesarean section. Doing well postoperatively.  Continue current care.  Lisa Carter 02/13/2014, 8:03 AM

## 2014-02-13 NOTE — Addendum Note (Signed)
Addendum created 02/13/14 1557 by Shanon PayorSuzanne M Nalleli Largent, CRNA   Modules edited: Notes Section   Notes Section:  File: [id number]

## 2014-02-13 NOTE — Lactation Note (Signed)
This note was copied from the chart of Boy Tempie DonningRhonda Lorenson. Lactation Consultation Note  Patient Name: Boy Tempie DonningRhonda Deyton ZOXWR'UToday's Date: 02/13/2014 Reason for consult: Follow-up assessment;Late preterm infant Follow-up appointment, baby 17 hours of life. Mom and baby sleepy, baby asleep at breast. Moved baby to crib for mother. Showed mom how to hand express, colostrum present. Mom concerned whether baby "getting enough." Reviewed basics. Enc mom to call out for assistance as needed. Baby voided twice, and has stooled.   Maternal Data    Feeding Feeding Type:  (Baby asleep at mom's breast, mom had fallen asleep as well, moved baby to crib.)  LATCH Score/Interventions Latch: Grasps breast easily, tongue down, lips flanged, rhythmical sucking.  Audible Swallowing: None  Type of Nipple: Everted at rest and after stimulation  Comfort (Breast/Nipple): Soft / non-tender     Hold (Positioning): No assistance needed to correctly position infant at breast.  LATCH Score: 8  Lactation Tools Discussed/Used     Consult Status Consult Status: Follow-up Follow-up type: In-patient    Sherlyn HayJennifer D Xzayvier Fagin 02/13/2014, 3:18 PM

## 2014-02-13 NOTE — Progress Notes (Signed)
Patient was referred for history of depression/anxiety. * Referral screened out by Clinical Social Worker because none of the following criteria appear to apply:  ~ History of anxiety/depression during this pregnancy, or of post-partum depression.  ~ Diagnosis of anxiety and/or depression within last 3 years  ~ History of depression due to pregnancy loss/loss of child  OR * Patient's symptoms currently being treated with medication (Celexa) and/or therapy.  Please contact the Clinical Social Worker if needs arise, or by the patient's request.  

## 2014-02-13 NOTE — Anesthesia Postprocedure Evaluation (Signed)
  Anesthesia Post-op Note  Patient: Lisa DonningRhonda Carter  Procedure(s) Performed: Procedure(s): Repeat Cesarean Section Delivery Baby Boy@ 2139, Apgars 9/9 (N/A)  Patient Location: Mother/Baby  Anesthesia Type:Spinal  Level of Consciousness: awake, alert  and oriented  Airway and Oxygen Therapy: Patient Spontanous Breathing  Post-op Pain: none  Post-op Assessment: Post-op Vital signs reviewed, Patient's Cardiovascular Status Stable, Respiratory Function Stable, No headache, No backache, No residual numbness and No residual motor weakness  Post-op Vital Signs: Reviewed and stable  Last Vitals:  Filed Vitals:   02/13/14 1429  BP: 92/55  Pulse: 65  Temp: 36.6 C  Resp: 16    Complications: No apparent anesthesia complications

## 2014-02-14 DIAGNOSIS — Z98891 History of uterine scar from previous surgery: Secondary | ICD-10-CM

## 2014-02-14 MED ORDER — HYDROMORPHONE HCL 2 MG PO TABS: 2.0000 mg | ORAL_TABLET | ORAL | Status: AC | PRN

## 2014-02-14 MED ORDER — HYDROMORPHONE HCL 2 MG PO TABS: 1.0000 mg | ORAL_TABLET | ORAL | Status: DC | PRN

## 2014-02-14 NOTE — Discharge Summary (Signed)
Obstetric Discharge Summary Reason for Admission: spontaneous rupture of membranes for repeat cesarean section Prenatal Procedures: none Intrapartum Procedures: cesarean: low cervical, transverse Postpartum Procedures: none Complications-Operative and Postpartum: none Hemoglobin  Date Value Ref Range Status  02/13/2014 9.8* 12.0 - 15.0 g/dL Final     HCT  Date Value Ref Range Status  02/13/2014 28.6* 36.0 - 46.0 % Final    Physical Exam:  General: alert Lochia: appropriate Uterine Fundus: firm Incision: healing well DVT Evaluation: No evidence of DVT seen on physical exam.  Discharge Diagnoses: Term Pregnancy-delivered  Discharge Information: Date: 02/14/2014 Activity: pelvic rest Diet: routine Medications: PNV, Ibuprofen and dilaudid Condition: stable Instructions: refer to practice specific booklet Discharge to: home   Newborn Data: Live born female  Birth Weight: 7 lb 13.8 oz (3566 g) APGAR: 9, 9  Home with mother.  Raphael GibneyJohn S Rogue Pautler 02/14/2014, 9:43 AM

## 2014-02-14 NOTE — Lactation Note (Signed)
This note was copied from the chart of Lisa Carter Salemi. Lactation Consultation Note Mother & baby discharged and getting ready to leave.  Mother had lots of questions about breastfeeding.   Reviewed deep, wide latch, engorgement care and feeding 8-12 times a day for more than 10 min. Encouraged mother to stay for a little longer to assist with breastfeeding but she wanted to leave and states she will call or come to support group. Patient Name: Lisa Carter Dzikowski EAVWU'JToday's Date: 02/14/2014 Reason for consult: Follow-up assessment   Maternal Data    Feeding    LATCH Score/Interventions                      Lactation Tools Discussed/Used     Consult Status Consult Status: Complete    Hardie PulleyRuth Boschen Berkelhammer 02/14/2014, 12:01 PM

## 2014-02-14 NOTE — Progress Notes (Signed)
Subjective: Postpartum Day two: Cesarean Delivery Patient reports tolerating PO.    Objective: Vital signs in last 24 hours: Temp:  [97.8 F (36.6 C)-98.5 F (36.9 C)] 98.3 F (36.8 C) (04/11 0533) Pulse Rate:  [62-71] 62 (04/11 0533) Resp:  [16-18] 18 (04/11 0533) BP: (92-105)/(54-68) 97/61 mmHg (04/11 0533) SpO2:  [96 %-98 %] 96 % (04/11 0533)  Physical Exam:  General: alert Lochia: appropriate Uterine Fundus: firm Incision: healing well DVT Evaluation: No evidence of DVT seen on physical exam.   Recent Labs  02/12/14 2020 02/13/14 0649  HGB 11.7* 9.8*  HCT 33.5* 28.6*    Assessment/Plan: Status post Cesarean section. Doing well postoperatively.  Continue current care.  Raphael GibneyJohn S Zandrea Kenealy 02/14/2014, 9:35 AM

## 2014-02-16 ENCOUNTER — Encounter (HOSPITAL_COMMUNITY): Payer: Self-pay | Admitting: Obstetrics and Gynecology

## 2014-02-19 ENCOUNTER — Ambulatory Visit (HOSPITAL_COMMUNITY): Payer: 59

## 2014-02-23 ENCOUNTER — Inpatient Hospital Stay (HOSPITAL_COMMUNITY)
Admission: RE | Admit: 2014-02-23 | Payer: BC Managed Care – PPO | Source: Ambulatory Visit | Admitting: Obstetrics and Gynecology

## 2014-02-23 ENCOUNTER — Encounter (HOSPITAL_COMMUNITY): Admission: RE | Payer: Self-pay | Source: Ambulatory Visit

## 2014-02-23 SURGERY — Surgical Case
Anesthesia: Regional | Laterality: Bilateral

## 2014-07-02 ENCOUNTER — Other Ambulatory Visit: Payer: Self-pay

## 2014-07-02 DIAGNOSIS — Z1231 Encounter for screening mammogram for malignant neoplasm of breast: Secondary | ICD-10-CM

## 2014-09-02 ENCOUNTER — Ambulatory Visit: Payer: 59 | Attending: Obstetrics and Gynecology | Admitting: Physical Therapy

## 2014-09-02 DIAGNOSIS — N8184 Pelvic muscle wasting: Secondary | ICD-10-CM | POA: Diagnosis not present

## 2014-09-02 DIAGNOSIS — Z5189 Encounter for other specified aftercare: Secondary | ICD-10-CM | POA: Insufficient documentation

## 2014-09-02 DIAGNOSIS — R102 Pelvic and perineal pain: Secondary | ICD-10-CM | POA: Insufficient documentation

## 2014-09-02 DIAGNOSIS — R32 Unspecified urinary incontinence: Secondary | ICD-10-CM | POA: Diagnosis not present

## 2014-09-07 ENCOUNTER — Encounter (HOSPITAL_COMMUNITY): Payer: Self-pay | Admitting: Obstetrics and Gynecology

## 2014-09-07 ENCOUNTER — Ambulatory Visit: Payer: 59 | Attending: Obstetrics and Gynecology | Admitting: Physical Therapy

## 2014-09-07 DIAGNOSIS — Z5189 Encounter for other specified aftercare: Secondary | ICD-10-CM | POA: Insufficient documentation

## 2014-09-07 DIAGNOSIS — N8184 Pelvic muscle wasting: Secondary | ICD-10-CM | POA: Insufficient documentation

## 2014-09-07 DIAGNOSIS — R32 Unspecified urinary incontinence: Secondary | ICD-10-CM | POA: Insufficient documentation

## 2014-09-07 DIAGNOSIS — R102 Pelvic and perineal pain: Secondary | ICD-10-CM | POA: Insufficient documentation

## 2014-09-09 ENCOUNTER — Ambulatory Visit: Payer: 59 | Admitting: Physical Therapy

## 2014-09-09 DIAGNOSIS — Z5189 Encounter for other specified aftercare: Secondary | ICD-10-CM | POA: Diagnosis not present

## 2014-09-11 ENCOUNTER — Ambulatory Visit: Payer: 59

## 2014-09-16 ENCOUNTER — Ambulatory Visit
Admission: RE | Admit: 2014-09-16 | Discharge: 2014-09-16 | Disposition: A | Discharge status: Home / Self Care | Payer: 59 | Source: Ambulatory Visit

## 2014-09-16 ENCOUNTER — Ambulatory Visit: Payer: 59 | Admitting: Physical Therapy

## 2014-09-16 DIAGNOSIS — Z5189 Encounter for other specified aftercare: Secondary | ICD-10-CM | POA: Diagnosis not present

## 2014-09-16 DIAGNOSIS — Z1231 Encounter for screening mammogram for malignant neoplasm of breast: Secondary | ICD-10-CM

## 2014-09-18 ENCOUNTER — Other Ambulatory Visit: Payer: Self-pay | Admitting: Obstetrics and Gynecology

## 2014-09-18 DIAGNOSIS — R2231 Localized swelling, mass and lump, right upper limb: Secondary | ICD-10-CM

## 2014-09-23 ENCOUNTER — Ambulatory Visit: Payer: 59 | Admitting: Physical Therapy

## 2014-09-23 DIAGNOSIS — Z5189 Encounter for other specified aftercare: Secondary | ICD-10-CM | POA: Diagnosis not present

## 2014-10-07 ENCOUNTER — Ambulatory Visit: Payer: 59 | Attending: Obstetrics and Gynecology | Admitting: Physical Therapy

## 2014-10-07 DIAGNOSIS — R32 Unspecified urinary incontinence: Secondary | ICD-10-CM | POA: Insufficient documentation

## 2014-10-07 DIAGNOSIS — N8184 Pelvic muscle wasting: Secondary | ICD-10-CM | POA: Insufficient documentation

## 2014-10-07 DIAGNOSIS — R102 Pelvic and perineal pain: Secondary | ICD-10-CM | POA: Diagnosis not present

## 2014-10-07 DIAGNOSIS — Z5189 Encounter for other specified aftercare: Secondary | ICD-10-CM | POA: Diagnosis not present

## 2014-10-14 ENCOUNTER — Ambulatory Visit: Payer: 59 | Admitting: Physical Therapy

## 2014-10-14 DIAGNOSIS — Z5189 Encounter for other specified aftercare: Secondary | ICD-10-CM | POA: Diagnosis not present

## 2014-10-21 ENCOUNTER — Ambulatory Visit: Payer: 59 | Admitting: Physical Therapy

## 2014-10-21 DIAGNOSIS — Z5189 Encounter for other specified aftercare: Secondary | ICD-10-CM | POA: Diagnosis not present

## 2014-10-22 ENCOUNTER — Other Ambulatory Visit: Payer: 59

## 2014-10-23 ENCOUNTER — Ambulatory Visit
Admission: RE | Admit: 2014-10-23 | Discharge: 2014-10-23 | Disposition: A | Discharge status: Home / Self Care | Payer: 59 | Source: Ambulatory Visit | Attending: Obstetrics and Gynecology | Admitting: Obstetrics and Gynecology

## 2014-10-23 DIAGNOSIS — R2231 Localized swelling, mass and lump, right upper limb: Secondary | ICD-10-CM

## 2014-10-28 ENCOUNTER — Ambulatory Visit: Payer: 59 | Admitting: Physical Therapy

## 2014-11-04 ENCOUNTER — Ambulatory Visit: Payer: 59 | Admitting: Physical Therapy

## 2014-11-04 DIAGNOSIS — Z5189 Encounter for other specified aftercare: Secondary | ICD-10-CM | POA: Diagnosis not present

## 2015-10-14 ENCOUNTER — Other Ambulatory Visit: Payer: Self-pay

## 2015-10-14 DIAGNOSIS — Z1231 Encounter for screening mammogram for malignant neoplasm of breast: Secondary | ICD-10-CM

## 2015-11-05 ENCOUNTER — Ambulatory Visit
Admission: RE | Admit: 2015-11-05 | Discharge: 2015-11-05 | Disposition: A | Discharge status: Home / Self Care | Payer: 59 | Source: Ambulatory Visit

## 2015-11-05 ENCOUNTER — Other Ambulatory Visit: Payer: Self-pay | Admitting: Obstetrics and Gynecology

## 2015-11-05 DIAGNOSIS — Z1231 Encounter for screening mammogram for malignant neoplasm of breast: Secondary | ICD-10-CM

## 2015-11-05 DIAGNOSIS — N6310 Unspecified lump in the right breast, unspecified quadrant: Secondary | ICD-10-CM

## 2015-11-09 ENCOUNTER — Ambulatory Visit
Admission: RE | Admit: 2015-11-09 | Discharge: 2015-11-09 | Disposition: A | Discharge status: Home / Self Care | Payer: 59 | Source: Ambulatory Visit | Attending: Obstetrics and Gynecology | Admitting: Obstetrics and Gynecology

## 2015-11-09 DIAGNOSIS — N6310 Unspecified lump in the right breast, unspecified quadrant: Secondary | ICD-10-CM

## 2015-11-16 ENCOUNTER — Ambulatory Visit: Payer: Self-pay

## 2016-03-21 ENCOUNTER — Other Ambulatory Visit: Payer: Self-pay | Admitting: Obstetrics and Gynecology

## 2016-03-21 DIAGNOSIS — N6001 Solitary cyst of right breast: Secondary | ICD-10-CM

## 2016-03-22 ENCOUNTER — Ambulatory Visit
Admission: RE | Admit: 2016-03-22 | Discharge: 2016-03-22 | Disposition: A | Discharge status: Home / Self Care | Payer: 59 | Source: Ambulatory Visit | Attending: Obstetrics and Gynecology | Admitting: Obstetrics and Gynecology

## 2016-03-22 DIAGNOSIS — N6001 Solitary cyst of right breast: Secondary | ICD-10-CM

## 2016-03-25 LAB — CULTURE, ROUTINE-ABSCESS: CULTURE: NO GROWTH

## 2017-01-04 DIAGNOSIS — Z01419 Encounter for gynecological examination (general) (routine) without abnormal findings: Secondary | ICD-10-CM | POA: Diagnosis not present

## 2017-01-04 DIAGNOSIS — Z6822 Body mass index (BMI) 22.0-22.9, adult: Secondary | ICD-10-CM | POA: Diagnosis not present

## 2017-02-22 DIAGNOSIS — R198 Other specified symptoms and signs involving the digestive system and abdomen: Secondary | ICD-10-CM | POA: Diagnosis not present

## 2017-02-22 DIAGNOSIS — K921 Melena: Secondary | ICD-10-CM | POA: Diagnosis not present

## 2017-03-22 DIAGNOSIS — R198 Other specified symptoms and signs involving the digestive system and abdomen: Secondary | ICD-10-CM | POA: Diagnosis not present

## 2017-03-22 DIAGNOSIS — K921 Melena: Secondary | ICD-10-CM | POA: Diagnosis not present

## 2017-04-30 ENCOUNTER — Ambulatory Visit: Payer: Self-pay | Admitting: Rheumatology

## 2017-05-15 DIAGNOSIS — Z0000 Encounter for general adult medical examination without abnormal findings: Secondary | ICD-10-CM | POA: Diagnosis not present

## 2017-05-16 DIAGNOSIS — N390 Urinary tract infection, site not specified: Secondary | ICD-10-CM | POA: Diagnosis not present

## 2017-05-16 DIAGNOSIS — Z0000 Encounter for general adult medical examination without abnormal findings: Secondary | ICD-10-CM | POA: Diagnosis not present

## 2017-05-16 DIAGNOSIS — Z23 Encounter for immunization: Secondary | ICD-10-CM | POA: Diagnosis not present

## 2017-06-28 ENCOUNTER — Other Ambulatory Visit: Payer: Self-pay | Admitting: Obstetrics and Gynecology

## 2017-06-28 DIAGNOSIS — Z1231 Encounter for screening mammogram for malignant neoplasm of breast: Secondary | ICD-10-CM

## 2017-07-11 DIAGNOSIS — Z23 Encounter for immunization: Secondary | ICD-10-CM | POA: Diagnosis not present

## 2017-07-16 DIAGNOSIS — M25552 Pain in left hip: Secondary | ICD-10-CM | POA: Diagnosis not present

## 2017-07-16 DIAGNOSIS — M25551 Pain in right hip: Secondary | ICD-10-CM | POA: Diagnosis not present

## 2017-07-16 DIAGNOSIS — M722 Plantar fascial fibromatosis: Secondary | ICD-10-CM | POA: Diagnosis not present

## 2017-07-18 ENCOUNTER — Other Ambulatory Visit: Payer: Self-pay | Admitting: General Surgery

## 2017-07-18 ENCOUNTER — Other Ambulatory Visit: Payer: Self-pay | Admitting: Obstetrics and Gynecology

## 2017-07-18 ENCOUNTER — Ambulatory Visit
Admission: RE | Admit: 2017-07-18 | Discharge: 2017-07-18 | Disposition: A | Discharge status: Home / Self Care | Payer: 59 | Source: Ambulatory Visit | Attending: Obstetrics and Gynecology | Admitting: Obstetrics and Gynecology

## 2017-07-18 ENCOUNTER — Ambulatory Visit: Admission: RE | Admit: 2017-07-18 | Payer: 59 | Source: Ambulatory Visit

## 2017-07-18 ENCOUNTER — Inpatient Hospital Stay: Admission: RE | Admit: 2017-07-18 | Payer: 59 | Source: Ambulatory Visit

## 2017-07-18 DIAGNOSIS — N6310 Unspecified lump in the right breast, unspecified quadrant: Secondary | ICD-10-CM

## 2017-07-18 DIAGNOSIS — N6001 Solitary cyst of right breast: Secondary | ICD-10-CM

## 2017-07-18 DIAGNOSIS — R922 Inconclusive mammogram: Secondary | ICD-10-CM | POA: Diagnosis not present

## 2017-07-25 DIAGNOSIS — M722 Plantar fascial fibromatosis: Secondary | ICD-10-CM | POA: Diagnosis not present

## 2017-07-25 DIAGNOSIS — M25551 Pain in right hip: Secondary | ICD-10-CM | POA: Diagnosis not present

## 2017-07-25 DIAGNOSIS — M25552 Pain in left hip: Secondary | ICD-10-CM | POA: Diagnosis not present

## 2017-07-31 DIAGNOSIS — M722 Plantar fascial fibromatosis: Secondary | ICD-10-CM | POA: Diagnosis not present

## 2017-07-31 DIAGNOSIS — M25552 Pain in left hip: Secondary | ICD-10-CM | POA: Diagnosis not present

## 2017-07-31 DIAGNOSIS — M25551 Pain in right hip: Secondary | ICD-10-CM | POA: Diagnosis not present

## 2017-11-14 DIAGNOSIS — Z23 Encounter for immunization: Secondary | ICD-10-CM | POA: Diagnosis not present

## 2017-11-20 DIAGNOSIS — K5900 Constipation, unspecified: Secondary | ICD-10-CM | POA: Diagnosis not present

## 2017-11-20 DIAGNOSIS — Z6824 Body mass index (BMI) 24.0-24.9, adult: Secondary | ICD-10-CM | POA: Diagnosis not present

## 2017-11-20 DIAGNOSIS — N390 Urinary tract infection, site not specified: Secondary | ICD-10-CM | POA: Diagnosis not present

## 2018-01-01 DIAGNOSIS — N390 Urinary tract infection, site not specified: Secondary | ICD-10-CM | POA: Diagnosis not present

## 2018-01-01 DIAGNOSIS — Z6823 Body mass index (BMI) 23.0-23.9, adult: Secondary | ICD-10-CM | POA: Diagnosis not present

## 2018-02-20 DIAGNOSIS — Z6823 Body mass index (BMI) 23.0-23.9, adult: Secondary | ICD-10-CM | POA: Diagnosis not present

## 2018-02-20 DIAGNOSIS — N952 Postmenopausal atrophic vaginitis: Secondary | ICD-10-CM | POA: Diagnosis not present

## 2018-02-20 DIAGNOSIS — N390 Urinary tract infection, site not specified: Secondary | ICD-10-CM | POA: Diagnosis not present

## 2018-03-25 ENCOUNTER — Encounter (INDEPENDENT_AMBULATORY_CARE_PROVIDER_SITE_OTHER): Payer: Self-pay

## 2018-04-03 DIAGNOSIS — N390 Urinary tract infection, site not specified: Secondary | ICD-10-CM | POA: Diagnosis not present

## 2018-04-03 DIAGNOSIS — R6889 Other general symptoms and signs: Secondary | ICD-10-CM | POA: Diagnosis not present

## 2018-05-14 DIAGNOSIS — R6889 Other general symptoms and signs: Secondary | ICD-10-CM | POA: Diagnosis not present

## 2018-05-14 DIAGNOSIS — Z0000 Encounter for general adult medical examination without abnormal findings: Secondary | ICD-10-CM | POA: Diagnosis not present

## 2018-05-14 DIAGNOSIS — E559 Vitamin D deficiency, unspecified: Secondary | ICD-10-CM | POA: Diagnosis not present

## 2018-05-21 DIAGNOSIS — Z0000 Encounter for general adult medical examination without abnormal findings: Secondary | ICD-10-CM | POA: Diagnosis not present

## 2018-05-21 DIAGNOSIS — N390 Urinary tract infection, site not specified: Secondary | ICD-10-CM | POA: Diagnosis not present

## 2018-05-21 DIAGNOSIS — Z6823 Body mass index (BMI) 23.0-23.9, adult: Secondary | ICD-10-CM | POA: Diagnosis not present

## 2018-08-07 DIAGNOSIS — N390 Urinary tract infection, site not specified: Secondary | ICD-10-CM | POA: Diagnosis not present

## 2018-08-07 DIAGNOSIS — N9419 Other specified dyspareunia: Secondary | ICD-10-CM | POA: Diagnosis not present

## 2018-08-07 DIAGNOSIS — N393 Stress incontinence (female) (male): Secondary | ICD-10-CM | POA: Diagnosis not present

## 2018-08-07 DIAGNOSIS — N816 Rectocele: Secondary | ICD-10-CM | POA: Diagnosis not present

## 2018-08-13 DIAGNOSIS — F4322 Adjustment disorder with anxiety: Secondary | ICD-10-CM | POA: Diagnosis not present

## 2018-08-28 ENCOUNTER — Other Ambulatory Visit: Payer: Self-pay | Admitting: Obstetrics and Gynecology

## 2018-08-28 DIAGNOSIS — Z1231 Encounter for screening mammogram for malignant neoplasm of breast: Secondary | ICD-10-CM

## 2018-09-11 DIAGNOSIS — N3946 Mixed incontinence: Secondary | ICD-10-CM | POA: Diagnosis not present

## 2018-09-11 DIAGNOSIS — K5902 Outlet dysfunction constipation: Secondary | ICD-10-CM | POA: Diagnosis not present

## 2018-09-11 DIAGNOSIS — N398 Other specified disorders of urinary system: Secondary | ICD-10-CM | POA: Diagnosis not present

## 2018-09-11 DIAGNOSIS — N816 Rectocele: Secondary | ICD-10-CM | POA: Diagnosis not present

## 2018-09-11 DIAGNOSIS — N393 Stress incontinence (female) (male): Secondary | ICD-10-CM | POA: Diagnosis not present

## 2018-09-12 DIAGNOSIS — F4322 Adjustment disorder with anxiety: Secondary | ICD-10-CM | POA: Diagnosis not present

## 2018-09-24 DIAGNOSIS — F4322 Adjustment disorder with anxiety: Secondary | ICD-10-CM | POA: Diagnosis not present

## 2018-10-08 DIAGNOSIS — N393 Stress incontinence (female) (male): Secondary | ICD-10-CM | POA: Diagnosis not present

## 2018-10-08 DIAGNOSIS — N8111 Cystocele, midline: Secondary | ICD-10-CM | POA: Diagnosis not present

## 2018-10-08 DIAGNOSIS — M62838 Other muscle spasm: Secondary | ICD-10-CM | POA: Diagnosis not present

## 2018-10-08 DIAGNOSIS — M6281 Muscle weakness (generalized): Secondary | ICD-10-CM | POA: Diagnosis not present

## 2018-10-11 ENCOUNTER — Ambulatory Visit
Admission: RE | Admit: 2018-10-11 | Discharge: 2018-10-11 | Disposition: A | Discharge status: Home / Self Care | Payer: BLUE CROSS/BLUE SHIELD | Source: Ambulatory Visit | Attending: Obstetrics and Gynecology | Admitting: Obstetrics and Gynecology

## 2018-10-11 DIAGNOSIS — Z1231 Encounter for screening mammogram for malignant neoplasm of breast: Secondary | ICD-10-CM | POA: Diagnosis not present

## 2018-10-18 DIAGNOSIS — F4322 Adjustment disorder with anxiety: Secondary | ICD-10-CM | POA: Diagnosis not present

## 2018-10-31 DIAGNOSIS — N393 Stress incontinence (female) (male): Secondary | ICD-10-CM | POA: Diagnosis not present

## 2018-10-31 DIAGNOSIS — M6281 Muscle weakness (generalized): Secondary | ICD-10-CM | POA: Diagnosis not present

## 2018-10-31 DIAGNOSIS — M62838 Other muscle spasm: Secondary | ICD-10-CM | POA: Diagnosis not present

## 2018-10-31 DIAGNOSIS — N816 Rectocele: Secondary | ICD-10-CM | POA: Diagnosis not present

## 2018-11-01 DIAGNOSIS — M62838 Other muscle spasm: Secondary | ICD-10-CM | POA: Diagnosis not present

## 2018-11-01 DIAGNOSIS — N8111 Cystocele, midline: Secondary | ICD-10-CM | POA: Diagnosis not present

## 2018-11-01 DIAGNOSIS — M6281 Muscle weakness (generalized): Secondary | ICD-10-CM | POA: Diagnosis not present

## 2018-11-01 DIAGNOSIS — N816 Rectocele: Secondary | ICD-10-CM | POA: Diagnosis not present

## 2018-11-13 DIAGNOSIS — M62838 Other muscle spasm: Secondary | ICD-10-CM | POA: Diagnosis not present

## 2018-11-13 DIAGNOSIS — N8111 Cystocele, midline: Secondary | ICD-10-CM | POA: Diagnosis not present

## 2018-11-13 DIAGNOSIS — M6281 Muscle weakness (generalized): Secondary | ICD-10-CM | POA: Diagnosis not present

## 2018-11-13 DIAGNOSIS — R102 Pelvic and perineal pain: Secondary | ICD-10-CM | POA: Diagnosis not present

## 2018-11-14 DIAGNOSIS — E559 Vitamin D deficiency, unspecified: Secondary | ICD-10-CM | POA: Diagnosis not present

## 2018-11-20 DIAGNOSIS — N3289 Other specified disorders of bladder: Secondary | ICD-10-CM | POA: Diagnosis not present

## 2018-11-20 DIAGNOSIS — N952 Postmenopausal atrophic vaginitis: Secondary | ICD-10-CM | POA: Diagnosis not present

## 2018-11-20 DIAGNOSIS — Z6823 Body mass index (BMI) 23.0-23.9, adult: Secondary | ICD-10-CM | POA: Diagnosis not present

## 2018-11-20 DIAGNOSIS — E559 Vitamin D deficiency, unspecified: Secondary | ICD-10-CM | POA: Diagnosis not present

## 2018-11-20 DIAGNOSIS — Z23 Encounter for immunization: Secondary | ICD-10-CM | POA: Diagnosis not present

## 2018-11-25 DIAGNOSIS — N9419 Other specified dyspareunia: Secondary | ICD-10-CM | POA: Diagnosis not present

## 2018-11-25 DIAGNOSIS — M62838 Other muscle spasm: Secondary | ICD-10-CM | POA: Diagnosis not present

## 2018-11-25 DIAGNOSIS — N393 Stress incontinence (female) (male): Secondary | ICD-10-CM | POA: Diagnosis not present

## 2018-11-25 DIAGNOSIS — M6281 Muscle weakness (generalized): Secondary | ICD-10-CM | POA: Diagnosis not present

## 2018-11-26 DIAGNOSIS — R399 Unspecified symptoms and signs involving the genitourinary system: Secondary | ICD-10-CM | POA: Diagnosis not present

## 2018-12-04 DIAGNOSIS — F4322 Adjustment disorder with anxiety: Secondary | ICD-10-CM | POA: Diagnosis not present

## 2018-12-09 DIAGNOSIS — L709 Acne, unspecified: Secondary | ICD-10-CM | POA: Diagnosis not present

## 2018-12-09 DIAGNOSIS — Z6822 Body mass index (BMI) 22.0-22.9, adult: Secondary | ICD-10-CM | POA: Diagnosis not present

## 2018-12-17 DIAGNOSIS — N393 Stress incontinence (female) (male): Secondary | ICD-10-CM | POA: Diagnosis not present

## 2018-12-17 DIAGNOSIS — M62838 Other muscle spasm: Secondary | ICD-10-CM | POA: Diagnosis not present

## 2018-12-17 DIAGNOSIS — M6281 Muscle weakness (generalized): Secondary | ICD-10-CM | POA: Diagnosis not present

## 2018-12-17 DIAGNOSIS — N9419 Other specified dyspareunia: Secondary | ICD-10-CM | POA: Diagnosis not present

## 2018-12-18 DIAGNOSIS — F4322 Adjustment disorder with anxiety: Secondary | ICD-10-CM | POA: Diagnosis not present

## 2018-12-25 DIAGNOSIS — K5902 Outlet dysfunction constipation: Secondary | ICD-10-CM | POA: Diagnosis not present

## 2018-12-25 DIAGNOSIS — N393 Stress incontinence (female) (male): Secondary | ICD-10-CM | POA: Diagnosis not present

## 2018-12-25 DIAGNOSIS — N816 Rectocele: Secondary | ICD-10-CM | POA: Diagnosis not present

## 2019-01-01 DIAGNOSIS — F4322 Adjustment disorder with anxiety: Secondary | ICD-10-CM | POA: Diagnosis not present

## 2019-01-13 DIAGNOSIS — M6281 Muscle weakness (generalized): Secondary | ICD-10-CM | POA: Diagnosis not present

## 2019-01-13 DIAGNOSIS — M62838 Other muscle spasm: Secondary | ICD-10-CM | POA: Diagnosis not present

## 2019-01-13 DIAGNOSIS — N8111 Cystocele, midline: Secondary | ICD-10-CM | POA: Diagnosis not present

## 2019-01-13 DIAGNOSIS — R102 Pelvic and perineal pain: Secondary | ICD-10-CM | POA: Diagnosis not present

## 2019-02-05 DIAGNOSIS — N3289 Other specified disorders of bladder: Secondary | ICD-10-CM | POA: Diagnosis not present

## 2019-02-05 DIAGNOSIS — N952 Postmenopausal atrophic vaginitis: Secondary | ICD-10-CM | POA: Diagnosis not present

## 2019-02-05 DIAGNOSIS — N390 Urinary tract infection, site not specified: Secondary | ICD-10-CM | POA: Diagnosis not present

## 2019-02-06 DIAGNOSIS — L249 Irritant contact dermatitis, unspecified cause: Secondary | ICD-10-CM | POA: Diagnosis not present

## 2019-03-20 DIAGNOSIS — L719 Rosacea, unspecified: Secondary | ICD-10-CM | POA: Diagnosis not present

## 2019-09-05 ENCOUNTER — Other Ambulatory Visit: Payer: Self-pay | Admitting: Obstetrics and Gynecology

## 2019-09-05 DIAGNOSIS — Z1231 Encounter for screening mammogram for malignant neoplasm of breast: Secondary | ICD-10-CM

## 2019-11-17 ENCOUNTER — Other Ambulatory Visit: Payer: Self-pay

## 2019-11-17 ENCOUNTER — Ambulatory Visit
Admission: RE | Admit: 2019-11-17 | Discharge: 2019-11-17 | Disposition: A | Discharge status: Home / Self Care | Payer: 59 | Source: Ambulatory Visit | Attending: Obstetrics and Gynecology | Admitting: Obstetrics and Gynecology

## 2019-11-17 DIAGNOSIS — Z1231 Encounter for screening mammogram for malignant neoplasm of breast: Secondary | ICD-10-CM

## 2019-11-18 ENCOUNTER — Ambulatory Visit
Admission: RE | Admit: 2019-11-18 | Discharge: 2019-11-18 | Disposition: A | Discharge status: Home / Self Care | Payer: 59 | Source: Ambulatory Visit | Attending: Obstetrics and Gynecology | Admitting: Obstetrics and Gynecology

## 2019-11-18 ENCOUNTER — Other Ambulatory Visit: Payer: Self-pay

## 2019-11-18 ENCOUNTER — Other Ambulatory Visit: Payer: Self-pay | Admitting: Obstetrics and Gynecology

## 2019-11-18 DIAGNOSIS — R928 Other abnormal and inconclusive findings on diagnostic imaging of breast: Secondary | ICD-10-CM

## 2019-11-18 DIAGNOSIS — N6320 Unspecified lump in the left breast, unspecified quadrant: Secondary | ICD-10-CM

## 2019-11-19 ENCOUNTER — Ambulatory Visit
Admission: RE | Admit: 2019-11-19 | Discharge: 2019-11-19 | Disposition: A | Discharge status: Home / Self Care | Payer: 59 | Source: Ambulatory Visit | Attending: Obstetrics and Gynecology | Admitting: Obstetrics and Gynecology

## 2019-11-19 ENCOUNTER — Other Ambulatory Visit: Payer: Self-pay

## 2019-11-19 ENCOUNTER — Other Ambulatory Visit: Payer: Self-pay | Admitting: Obstetrics and Gynecology

## 2019-11-19 DIAGNOSIS — N6320 Unspecified lump in the left breast, unspecified quadrant: Secondary | ICD-10-CM

## 2020-01-02 ENCOUNTER — Other Ambulatory Visit: Payer: Self-pay | Admitting: Obstetrics and Gynecology

## 2020-01-02 DIAGNOSIS — Z803 Family history of malignant neoplasm of breast: Secondary | ICD-10-CM

## 2020-06-17 DIAGNOSIS — F4322 Adjustment disorder with anxiety: Secondary | ICD-10-CM | POA: Diagnosis not present

## 2020-07-08 DIAGNOSIS — F4322 Adjustment disorder with anxiety: Secondary | ICD-10-CM | POA: Diagnosis not present

## 2020-07-09 DIAGNOSIS — M62830 Muscle spasm of back: Secondary | ICD-10-CM | POA: Diagnosis not present

## 2020-07-09 DIAGNOSIS — M9905 Segmental and somatic dysfunction of pelvic region: Secondary | ICD-10-CM | POA: Diagnosis not present

## 2020-07-09 DIAGNOSIS — M9902 Segmental and somatic dysfunction of thoracic region: Secondary | ICD-10-CM | POA: Diagnosis not present

## 2020-07-09 DIAGNOSIS — M9903 Segmental and somatic dysfunction of lumbar region: Secondary | ICD-10-CM | POA: Diagnosis not present

## 2020-08-05 DIAGNOSIS — F4322 Adjustment disorder with anxiety: Secondary | ICD-10-CM | POA: Diagnosis not present

## 2020-08-20 DIAGNOSIS — F4322 Adjustment disorder with anxiety: Secondary | ICD-10-CM | POA: Diagnosis not present

## 2020-09-02 DIAGNOSIS — F4322 Adjustment disorder with anxiety: Secondary | ICD-10-CM | POA: Diagnosis not present

## 2020-10-07 DIAGNOSIS — F4322 Adjustment disorder with anxiety: Secondary | ICD-10-CM | POA: Diagnosis not present

## 2020-10-23 ENCOUNTER — Other Ambulatory Visit: Payer: BC Managed Care – PPO

## 2020-10-23 DIAGNOSIS — Z20822 Contact with and (suspected) exposure to covid-19: Secondary | ICD-10-CM

## 2020-10-26 LAB — NOVEL CORONAVIRUS, NAA: SARS-CoV-2, NAA: NOT DETECTED

## 2020-11-15 ENCOUNTER — Other Ambulatory Visit: Payer: Self-pay | Admitting: Family Medicine

## 2020-11-15 DIAGNOSIS — Z1231 Encounter for screening mammogram for malignant neoplasm of breast: Secondary | ICD-10-CM

## 2020-12-27 ENCOUNTER — Other Ambulatory Visit: Payer: Self-pay

## 2020-12-27 ENCOUNTER — Ambulatory Visit
Admission: RE | Admit: 2020-12-27 | Discharge: 2020-12-27 | Disposition: A | Discharge status: Home / Self Care | Payer: BC Managed Care – PPO | Source: Ambulatory Visit | Attending: Family Medicine | Admitting: Family Medicine

## 2020-12-27 DIAGNOSIS — Z1231 Encounter for screening mammogram for malignant neoplasm of breast: Secondary | ICD-10-CM

## 2021-01-04 DIAGNOSIS — F422 Mixed obsessional thoughts and acts: Secondary | ICD-10-CM | POA: Diagnosis not present

## 2021-01-21 DIAGNOSIS — F422 Mixed obsessional thoughts and acts: Secondary | ICD-10-CM | POA: Diagnosis not present

## 2021-01-26 ENCOUNTER — Telehealth: Payer: Self-pay

## 2021-01-26 NOTE — Telephone Encounter (Signed)
Thank you :)

## 2021-01-26 NOTE — Telephone Encounter (Signed)
Zettie Gootee,  Can you get in touch with Dr. Biondo and his wife about screening colonoscopies with me in the next several weeks. Thanks   Matt's cell is [phone] and his wife's number is below.        Previous Messages   ----- Message -----  From: Scarano, Matthew, MD  Sent: 01/26/2021 10:51 AM EDT  To: Daniel P Jacobs, MD, Savier Trickett L Shota Kohrs, RN  Subject: RE: wife and I                  Yes Thanks Dan. My cell works  My wife is Brooklee [phone].  ----- Message -----  From: Jacobs, Daniel P, MD  Sent: 01/26/2021 10:35 AM EDT  To: Matthew Basley, MD, Geo Slone L Abdulrahim Siddiqi, RN  Subject: RE: wife and I                  Matt,  Absolutely!   I will have my nurse Deaven Urwin get in touch with you both about scheduling. Can she start by reaching you on your cell? What is your wife's contact information?    

## 2021-01-28 ENCOUNTER — Encounter: Payer: Self-pay | Admitting: Gastroenterology

## 2021-01-28 NOTE — Telephone Encounter (Signed)
Colonoscopy scheduled for Friday 04-15-21 with Dr Christella Hartigan.

## 2021-02-02 DIAGNOSIS — F332 Major depressive disorder, recurrent severe without psychotic features: Secondary | ICD-10-CM | POA: Diagnosis not present

## 2021-02-03 DIAGNOSIS — F332 Major depressive disorder, recurrent severe without psychotic features: Secondary | ICD-10-CM | POA: Diagnosis not present

## 2021-02-04 DIAGNOSIS — F332 Major depressive disorder, recurrent severe without psychotic features: Secondary | ICD-10-CM | POA: Diagnosis not present

## 2021-02-05 DIAGNOSIS — F422 Mixed obsessional thoughts and acts: Secondary | ICD-10-CM | POA: Diagnosis not present

## 2021-02-07 DIAGNOSIS — F332 Major depressive disorder, recurrent severe without psychotic features: Secondary | ICD-10-CM | POA: Diagnosis not present

## 2021-02-08 DIAGNOSIS — F332 Major depressive disorder, recurrent severe without psychotic features: Secondary | ICD-10-CM | POA: Diagnosis not present

## 2021-02-09 DIAGNOSIS — F332 Major depressive disorder, recurrent severe without psychotic features: Secondary | ICD-10-CM | POA: Diagnosis not present

## 2021-02-10 DIAGNOSIS — F332 Major depressive disorder, recurrent severe without psychotic features: Secondary | ICD-10-CM | POA: Diagnosis not present

## 2021-02-11 DIAGNOSIS — F332 Major depressive disorder, recurrent severe without psychotic features: Secondary | ICD-10-CM | POA: Diagnosis not present

## 2021-02-14 DIAGNOSIS — F332 Major depressive disorder, recurrent severe without psychotic features: Secondary | ICD-10-CM | POA: Diagnosis not present

## 2021-02-15 DIAGNOSIS — F332 Major depressive disorder, recurrent severe without psychotic features: Secondary | ICD-10-CM | POA: Diagnosis not present

## 2021-02-15 DIAGNOSIS — F422 Mixed obsessional thoughts and acts: Secondary | ICD-10-CM | POA: Diagnosis not present

## 2021-02-16 DIAGNOSIS — F332 Major depressive disorder, recurrent severe without psychotic features: Secondary | ICD-10-CM | POA: Diagnosis not present

## 2021-02-17 DIAGNOSIS — F332 Major depressive disorder, recurrent severe without psychotic features: Secondary | ICD-10-CM | POA: Diagnosis not present

## 2021-02-18 DIAGNOSIS — F332 Major depressive disorder, recurrent severe without psychotic features: Secondary | ICD-10-CM | POA: Diagnosis not present

## 2021-02-28 DIAGNOSIS — F332 Major depressive disorder, recurrent severe without psychotic features: Secondary | ICD-10-CM | POA: Diagnosis not present

## 2021-03-01 DIAGNOSIS — F332 Major depressive disorder, recurrent severe without psychotic features: Secondary | ICD-10-CM | POA: Diagnosis not present

## 2021-03-01 DIAGNOSIS — F422 Mixed obsessional thoughts and acts: Secondary | ICD-10-CM | POA: Diagnosis not present

## 2021-03-02 DIAGNOSIS — F332 Major depressive disorder, recurrent severe without psychotic features: Secondary | ICD-10-CM | POA: Diagnosis not present

## 2021-03-03 DIAGNOSIS — F332 Major depressive disorder, recurrent severe without psychotic features: Secondary | ICD-10-CM | POA: Diagnosis not present

## 2021-03-04 DIAGNOSIS — F332 Major depressive disorder, recurrent severe without psychotic features: Secondary | ICD-10-CM | POA: Diagnosis not present

## 2021-03-07 DIAGNOSIS — F332 Major depressive disorder, recurrent severe without psychotic features: Secondary | ICD-10-CM | POA: Diagnosis not present

## 2021-03-08 DIAGNOSIS — F332 Major depressive disorder, recurrent severe without psychotic features: Secondary | ICD-10-CM | POA: Diagnosis not present

## 2021-03-10 DIAGNOSIS — F332 Major depressive disorder, recurrent severe without psychotic features: Secondary | ICD-10-CM | POA: Diagnosis not present

## 2021-03-11 DIAGNOSIS — F332 Major depressive disorder, recurrent severe without psychotic features: Secondary | ICD-10-CM | POA: Diagnosis not present

## 2021-03-14 DIAGNOSIS — F332 Major depressive disorder, recurrent severe without psychotic features: Secondary | ICD-10-CM | POA: Diagnosis not present

## 2021-03-15 DIAGNOSIS — F332 Major depressive disorder, recurrent severe without psychotic features: Secondary | ICD-10-CM | POA: Diagnosis not present

## 2021-03-16 DIAGNOSIS — F332 Major depressive disorder, recurrent severe without psychotic features: Secondary | ICD-10-CM | POA: Diagnosis not present

## 2021-03-17 DIAGNOSIS — F332 Major depressive disorder, recurrent severe without psychotic features: Secondary | ICD-10-CM | POA: Diagnosis not present

## 2021-03-18 DIAGNOSIS — F422 Mixed obsessional thoughts and acts: Secondary | ICD-10-CM | POA: Diagnosis not present

## 2021-03-18 DIAGNOSIS — F332 Major depressive disorder, recurrent severe without psychotic features: Secondary | ICD-10-CM | POA: Diagnosis not present

## 2021-03-21 DIAGNOSIS — F332 Major depressive disorder, recurrent severe without psychotic features: Secondary | ICD-10-CM | POA: Diagnosis not present

## 2021-03-22 DIAGNOSIS — F332 Major depressive disorder, recurrent severe without psychotic features: Secondary | ICD-10-CM | POA: Diagnosis not present

## 2021-03-23 DIAGNOSIS — F332 Major depressive disorder, recurrent severe without psychotic features: Secondary | ICD-10-CM | POA: Diagnosis not present

## 2021-03-24 DIAGNOSIS — F332 Major depressive disorder, recurrent severe without psychotic features: Secondary | ICD-10-CM | POA: Diagnosis not present

## 2021-03-24 DIAGNOSIS — D225 Melanocytic nevi of trunk: Secondary | ICD-10-CM | POA: Diagnosis not present

## 2021-03-24 DIAGNOSIS — D2261 Melanocytic nevi of right upper limb, including shoulder: Secondary | ICD-10-CM | POA: Diagnosis not present

## 2021-03-24 DIAGNOSIS — D1801 Hemangioma of skin and subcutaneous tissue: Secondary | ICD-10-CM | POA: Diagnosis not present

## 2021-03-24 DIAGNOSIS — L814 Other melanin hyperpigmentation: Secondary | ICD-10-CM | POA: Diagnosis not present

## 2021-03-25 DIAGNOSIS — F332 Major depressive disorder, recurrent severe without psychotic features: Secondary | ICD-10-CM | POA: Diagnosis not present

## 2021-03-29 DIAGNOSIS — F332 Major depressive disorder, recurrent severe without psychotic features: Secondary | ICD-10-CM | POA: Diagnosis not present

## 2021-03-31 DIAGNOSIS — F332 Major depressive disorder, recurrent severe without psychotic features: Secondary | ICD-10-CM | POA: Diagnosis not present

## 2021-04-01 ENCOUNTER — Other Ambulatory Visit: Payer: Self-pay

## 2021-04-01 ENCOUNTER — Ambulatory Visit (AMBULATORY_SURGERY_CENTER): Payer: BC Managed Care – PPO | Admitting: *Deleted

## 2021-04-01 VITALS — Ht 62.0 in | Wt 125.0 lb

## 2021-04-01 DIAGNOSIS — Z1211 Encounter for screening for malignant neoplasm of colon: Secondary | ICD-10-CM

## 2021-04-01 DIAGNOSIS — F332 Major depressive disorder, recurrent severe without psychotic features: Secondary | ICD-10-CM | POA: Diagnosis not present

## 2021-04-01 NOTE — Progress Notes (Addendum)
No egg or soy allergy known to patient  No issues with past sedation with any surgeries or procedures Patient denies ever being told they had issues or difficulty with intubation  No FH of Malignant Hyperthermia No diet pills per patient No home 02 use per patient  No blood thinners per patient  Pt denies issues with constipation  No A fib or A flutter  EMMI video to pt or via MyChart  COVID 19 guidelines implemented in [facility] today with Pt and RN  Pt is fully vaccinated  for Covid    Virtual pre visit completed. Instructions sent through secure email.  Due to the COVID-19 pandemic we are asking patients to follow certain guidelines.  Pt aware of COVID protocols and [facility] guidelines  

## 2021-04-05 DIAGNOSIS — F332 Major depressive disorder, recurrent severe without psychotic features: Secondary | ICD-10-CM | POA: Diagnosis not present

## 2021-04-06 DIAGNOSIS — F332 Major depressive disorder, recurrent severe without psychotic features: Secondary | ICD-10-CM | POA: Diagnosis not present

## 2021-04-11 DIAGNOSIS — F332 Major depressive disorder, recurrent severe without psychotic features: Secondary | ICD-10-CM | POA: Diagnosis not present

## 2021-04-12 DIAGNOSIS — F422 Mixed obsessional thoughts and acts: Secondary | ICD-10-CM | POA: Diagnosis not present

## 2021-04-14 ENCOUNTER — Encounter: Payer: Self-pay | Admitting: Certified Registered Nurse Anesthetist

## 2021-04-15 ENCOUNTER — Other Ambulatory Visit: Payer: Self-pay

## 2021-04-15 ENCOUNTER — Ambulatory Visit (AMBULATORY_SURGERY_CENTER): Payer: BC Managed Care – PPO | Admitting: Gastroenterology

## 2021-04-15 ENCOUNTER — Encounter: Payer: Self-pay | Admitting: Gastroenterology

## 2021-04-15 VITALS — BP 122/63 | HR 62 | Temp 97.8°F | Resp 21 | Ht 62.0 in | Wt 125.0 lb

## 2021-04-15 DIAGNOSIS — Z1211 Encounter for screening for malignant neoplasm of colon: Secondary | ICD-10-CM | POA: Diagnosis not present

## 2021-04-15 MED ORDER — SODIUM CHLORIDE 0.9 % IV SOLN: 500.0000 mL | Freq: Once | INTRAVENOUS | Status: DC

## 2021-04-15 NOTE — Progress Notes (Signed)
Report given to PACU, vss 

## 2021-04-15 NOTE — Op Note (Signed)
[facility] Patient Name: Lisa Carter Procedure Date: 04/15/2021 8:39 AM MRN: [id number] Endoscopist: Rachael Fee , MD Age: 50 Referring MD:  Date of Birth: [birth year] Gender: Female Account #: [id number] Procedure:                Colonoscopy Indications:              Screening for colorectal malignant neoplasm Medicines:                Monitored Anesthesia Care Procedure:                Pre-Anesthesia Assessment:                           - Prior to the procedure, a History and Physical                            was performed, and patient medications and                            allergies were reviewed. The patient's tolerance of                            previous anesthesia was also reviewed. The risks                            and benefits of the procedure and the sedation                            options and risks were discussed with the patient.                            All questions were answered, and informed consent                            was obtained. Prior Anticoagulants: The patient has                            taken no previous anticoagulant or antiplatelet                            agents. ASA Grade Assessment: I - A normal, healthy                            patient. After reviewing the risks and benefits,                            the patient was deemed in satisfactory condition to                            undergo the procedure.                           After obtaining informed consent, the colonoscope  was passed under direct vision. Throughout the                            procedure, the patient's blood pressure, pulse, and                            oxygen saturations were monitored continuously. The                            Olympus CF-HQ190L [id number] [id number]) Colonoscope was                            introduced through the anus and advanced to the the                            cecum, identified by  appendiceal orifice and                            ileocecal valve. The colonoscopy was performed                            without difficulty. The patient tolerated the                            procedure well. The quality of the bowel                            preparation was adequate. The ileocecal valve,                            appendiceal orifice, and rectum were photographed. Scope In: 8:59:34 AM Scope Out: 9:18:46 AM Scope Withdrawal Time: 0 hours 11 minutes 30 seconds  Total Procedure Duration: 0 hours 19 minutes 12 seconds  Findings:                 The entire examined colon appeared normal on direct                            and retroflexion views. Complications:            No immediate complications. Estimated blood loss:                            None. Estimated Blood Loss:     Estimated blood loss: none. Impression:               - The entire examined colon is normal on direct and                            retroflexion views.                           - No polyps or cancers. Recommendation:           - Patient has a contact number available for  emergencies. The signs and symptoms of potential                            delayed complications were discussed with the                            patient. Return to normal activities tomorrow.                            Written discharge instructions were provided to the                            patient.                           - Resume previous diet.                           - Continue present medications.                           - Repeat colonoscopy in 10 years for screening. Rachael Fee, MD 04/15/2021 9:21:54 AM This report has been signed electronically.

## 2021-04-15 NOTE — Patient Instructions (Signed)
Repeat colonoscopy in 10 years for screening purposes.  ° °YOU HAD AN ENDOSCOPIC PROCEDURE TODAY AT THE [facility]:   Refer to the procedure report that was given to you for any specific questions about what was found during the examination.  If the procedure report does not answer your questions, please call your gastroenterologist to clarify.  If you requested that your care partner not be given the details of your procedure findings, then the procedure report has been included in a sealed envelope for you to review at your convenience later. ° °YOU SHOULD EXPECT: Some feelings of bloating in the abdomen. Passage of more gas than usual.  Walking can help get rid of the air that was put into your GI tract during the procedure and reduce the bloating. If you had a lower endoscopy (such as a colonoscopy or flexible sigmoidoscopy) you may notice spotting of blood in your stool or on the toilet paper. If you underwent a bowel prep for your procedure, you may not have a normal bowel movement for a few days. ° °Please Note:  You might notice some irritation and congestion in your nose or some drainage.  This is from the oxygen used during your procedure.  There is no need for concern and it should clear up in a day or so. ° °SYMPTOMS TO REPORT IMMEDIATELY: ° °Following lower endoscopy (colonoscopy or flexible sigmoidoscopy): ° Excessive amounts of blood in the stool ° Significant tenderness or worsening of abdominal pains ° Swelling of the abdomen that is new, acute ° Fever of 100°F or higher ° °For urgent or emergent issues, a gastroenterologist can be reached at any hour by calling [phone]. °Do not use MyChart messaging for urgent concerns.  ° ° °DIET:  We do recommend a small meal at first, but then you may proceed to your regular diet.  Drink plenty of fluids but you should avoid alcoholic beverages for 24 hours. ° °ACTIVITY:  You should plan to take it easy for the rest of today and you should  NOT DRIVE or use heavy machinery until tomorrow (because of the sedation medicines used during the test).   ° °FOLLOW UP: °Our staff will call the number listed on your records 48-72 hours following your procedure to check on you and address any questions or concerns that you may have regarding the information given to you following your procedure. If we do not reach you, we will leave a message.  We will attempt to reach you two times.  During this call, we will ask if you have developed any symptoms of COVID 19. If you develop any symptoms (ie: fever, flu-like symptoms, shortness of breath, cough etc.) before then, please call [phone].  If you test positive for Covid 19 in the 2 weeks post procedure, please call and report this information to us.   ° °If any biopsies were taken you will be contacted by phone or by letter within the next 1-3 weeks.  Please call us at [phone] if you have not heard about the biopsies in 3 weeks.  ° ° °SIGNATURES/CONFIDENTIALITY: °You and/or your care partner have signed paperwork which will be entered into your electronic medical record.  These signatures attest to the fact that that the information above on your After Visit Summary has been reviewed and is understood.  Full responsibility of the confidentiality of this discharge information lies with you and/or your care-partner. ° °

## 2021-04-19 ENCOUNTER — Telehealth: Payer: Self-pay

## 2021-04-19 ENCOUNTER — Telehealth: Payer: Self-pay | Admitting: *Deleted

## 2021-04-19 NOTE — Telephone Encounter (Signed)
Follow up call made. 

## 2021-04-19 NOTE — Telephone Encounter (Signed)
Second post procedure follow up day, no answer

## 2021-04-26 DIAGNOSIS — F422 Mixed obsessional thoughts and acts: Secondary | ICD-10-CM | POA: Diagnosis not present

## 2021-04-27 DIAGNOSIS — Z6823 Body mass index (BMI) 23.0-23.9, adult: Secondary | ICD-10-CM | POA: Diagnosis not present

## 2021-04-27 DIAGNOSIS — Z01419 Encounter for gynecological examination (general) (routine) without abnormal findings: Secondary | ICD-10-CM | POA: Diagnosis not present

## 2021-05-17 DIAGNOSIS — F422 Mixed obsessional thoughts and acts: Secondary | ICD-10-CM | POA: Diagnosis not present

## 2021-05-25 DIAGNOSIS — Z13220 Encounter for screening for lipoid disorders: Secondary | ICD-10-CM | POA: Diagnosis not present

## 2021-05-25 DIAGNOSIS — Z13228 Encounter for screening for other metabolic disorders: Secondary | ICD-10-CM | POA: Diagnosis not present

## 2021-05-25 DIAGNOSIS — Z1329 Encounter for screening for other suspected endocrine disorder: Secondary | ICD-10-CM | POA: Diagnosis not present

## 2021-05-25 DIAGNOSIS — Z1321 Encounter for screening for nutritional disorder: Secondary | ICD-10-CM | POA: Diagnosis not present

## 2021-05-31 DIAGNOSIS — F422 Mixed obsessional thoughts and acts: Secondary | ICD-10-CM | POA: Diagnosis not present

## 2021-06-14 DIAGNOSIS — F422 Mixed obsessional thoughts and acts: Secondary | ICD-10-CM | POA: Diagnosis not present

## 2021-07-01 DIAGNOSIS — F422 Mixed obsessional thoughts and acts: Secondary | ICD-10-CM | POA: Diagnosis not present

## 2021-07-14 DIAGNOSIS — N939 Abnormal uterine and vaginal bleeding, unspecified: Secondary | ICD-10-CM | POA: Diagnosis not present

## 2021-07-14 DIAGNOSIS — N920 Excessive and frequent menstruation with regular cycle: Secondary | ICD-10-CM | POA: Diagnosis not present

## 2021-07-14 DIAGNOSIS — N926 Irregular menstruation, unspecified: Secondary | ICD-10-CM | POA: Diagnosis not present

## 2021-07-20 DIAGNOSIS — N924 Excessive bleeding in the premenopausal period: Secondary | ICD-10-CM | POA: Diagnosis not present

## 2021-08-02 ENCOUNTER — Telehealth: Payer: Self-pay

## 2021-08-02 NOTE — Telephone Encounter (Signed)
Labs patient had drawn in May 2017:  Comprehensive metabolic panel was normal.  ANCA was negative.  ANA was borderline positive at 1:280 nucleolar homogenous pattern.  Complements were normal.  Beta-2 anticardiolipin, cryoglobulins, HLA-B27, G6PD, and SPEP were all normal.

## 2021-08-02 NOTE — Telephone Encounter (Signed)
The screening test for hemochromatosis are ferritin, TIBC.  Please review these tests have been done or not and notify the patient.

## 2021-08-02 NOTE — Telephone Encounter (Signed)
The screening test for hemochromatosis are ferritin, TIBC.  Reviewed patient's chart in [facility] and these test have not been performed. Patient advised.

## 2021-08-02 NOTE — Telephone Encounter (Signed)
Lisa Carter called stating she was a patient of Dr. Corliss Skains and her last appointment was 05/01/2016 (notes/labs in [facility]).  Patient requested to speak with the nurse to see if she was ever tested for Hemochromatosis.

## 2021-08-16 DIAGNOSIS — M62838 Other muscle spasm: Secondary | ICD-10-CM | POA: Diagnosis not present

## 2021-08-16 DIAGNOSIS — M9903 Segmental and somatic dysfunction of lumbar region: Secondary | ICD-10-CM | POA: Diagnosis not present

## 2021-08-16 DIAGNOSIS — M9905 Segmental and somatic dysfunction of pelvic region: Secondary | ICD-10-CM | POA: Diagnosis not present

## 2021-08-16 DIAGNOSIS — M62830 Muscle spasm of back: Secondary | ICD-10-CM | POA: Diagnosis not present

## 2021-08-16 DIAGNOSIS — M9902 Segmental and somatic dysfunction of thoracic region: Secondary | ICD-10-CM | POA: Diagnosis not present

## 2021-09-07 DIAGNOSIS — D225 Melanocytic nevi of trunk: Secondary | ICD-10-CM | POA: Diagnosis not present

## 2021-10-03 DIAGNOSIS — F411 Generalized anxiety disorder: Secondary | ICD-10-CM | POA: Diagnosis not present

## 2021-12-01 ENCOUNTER — Other Ambulatory Visit: Payer: Self-pay | Admitting: Obstetrics and Gynecology

## 2021-12-01 ENCOUNTER — Other Ambulatory Visit: Payer: Self-pay | Admitting: Family Medicine

## 2021-12-01 DIAGNOSIS — Z1231 Encounter for screening mammogram for malignant neoplasm of breast: Secondary | ICD-10-CM

## 2021-12-28 ENCOUNTER — Ambulatory Visit: Payer: BC Managed Care – PPO

## 2022-01-03 ENCOUNTER — Ambulatory Visit
Admission: RE | Admit: 2022-01-03 | Discharge: 2022-01-03 | Disposition: A | Discharge status: Home / Self Care | Payer: BC Managed Care – PPO | Source: Ambulatory Visit | Attending: Obstetrics and Gynecology | Admitting: Obstetrics and Gynecology

## 2022-01-03 DIAGNOSIS — Z1231 Encounter for screening mammogram for malignant neoplasm of breast: Secondary | ICD-10-CM | POA: Diagnosis not present

## 2022-01-05 ENCOUNTER — Other Ambulatory Visit: Payer: Self-pay | Admitting: Obstetrics and Gynecology

## 2022-01-05 DIAGNOSIS — R928 Other abnormal and inconclusive findings on diagnostic imaging of breast: Secondary | ICD-10-CM

## 2022-01-06 ENCOUNTER — Ambulatory Visit
Admission: RE | Admit: 2022-01-06 | Discharge: 2022-01-06 | Disposition: A | Discharge status: Home / Self Care | Payer: BC Managed Care – PPO | Source: Ambulatory Visit | Attending: Obstetrics and Gynecology | Admitting: Obstetrics and Gynecology

## 2022-01-06 ENCOUNTER — Other Ambulatory Visit: Payer: Self-pay

## 2022-01-06 DIAGNOSIS — R922 Inconclusive mammogram: Secondary | ICD-10-CM | POA: Diagnosis not present

## 2022-01-06 DIAGNOSIS — R928 Other abnormal and inconclusive findings on diagnostic imaging of breast: Secondary | ICD-10-CM

## 2022-01-24 ENCOUNTER — Other Ambulatory Visit: Payer: BC Managed Care – PPO

## 2022-01-31 DIAGNOSIS — Z8349 Family history of other endocrine, nutritional and metabolic diseases: Secondary | ICD-10-CM | POA: Diagnosis not present

## 2022-03-29 DIAGNOSIS — L719 Rosacea, unspecified: Secondary | ICD-10-CM | POA: Diagnosis not present

## 2022-03-29 DIAGNOSIS — D225 Melanocytic nevi of trunk: Secondary | ICD-10-CM | POA: Diagnosis not present

## 2022-03-29 DIAGNOSIS — L578 Other skin changes due to chronic exposure to nonionizing radiation: Secondary | ICD-10-CM | POA: Diagnosis not present

## 2022-05-24 DIAGNOSIS — Z01419 Encounter for gynecological examination (general) (routine) without abnormal findings: Secondary | ICD-10-CM | POA: Diagnosis not present

## 2022-05-24 DIAGNOSIS — Z6823 Body mass index (BMI) 23.0-23.9, adult: Secondary | ICD-10-CM | POA: Diagnosis not present

## 2022-07-12 DIAGNOSIS — J029 Acute pharyngitis, unspecified: Secondary | ICD-10-CM | POA: Diagnosis not present

## 2022-07-12 DIAGNOSIS — Z20822 Contact with and (suspected) exposure to covid-19: Secondary | ICD-10-CM | POA: Diagnosis not present

## 2022-07-31 DIAGNOSIS — M9902 Segmental and somatic dysfunction of thoracic region: Secondary | ICD-10-CM | POA: Diagnosis not present

## 2022-07-31 DIAGNOSIS — M9903 Segmental and somatic dysfunction of lumbar region: Secondary | ICD-10-CM | POA: Diagnosis not present

## 2022-07-31 DIAGNOSIS — M9905 Segmental and somatic dysfunction of pelvic region: Secondary | ICD-10-CM | POA: Diagnosis not present

## 2022-07-31 DIAGNOSIS — M62830 Muscle spasm of back: Secondary | ICD-10-CM | POA: Diagnosis not present

## 2022-08-02 DIAGNOSIS — M9906 Segmental and somatic dysfunction of lower extremity: Secondary | ICD-10-CM | POA: Diagnosis not present

## 2022-08-02 DIAGNOSIS — M62830 Muscle spasm of back: Secondary | ICD-10-CM | POA: Diagnosis not present

## 2022-08-02 DIAGNOSIS — M9903 Segmental and somatic dysfunction of lumbar region: Secondary | ICD-10-CM | POA: Diagnosis not present

## 2022-08-02 DIAGNOSIS — M9902 Segmental and somatic dysfunction of thoracic region: Secondary | ICD-10-CM | POA: Diagnosis not present

## 2022-08-02 DIAGNOSIS — M9905 Segmental and somatic dysfunction of pelvic region: Secondary | ICD-10-CM | POA: Diagnosis not present

## 2022-08-07 DIAGNOSIS — M9905 Segmental and somatic dysfunction of pelvic region: Secondary | ICD-10-CM | POA: Diagnosis not present

## 2022-08-07 DIAGNOSIS — M9903 Segmental and somatic dysfunction of lumbar region: Secondary | ICD-10-CM | POA: Diagnosis not present

## 2022-08-07 DIAGNOSIS — M62830 Muscle spasm of back: Secondary | ICD-10-CM | POA: Diagnosis not present

## 2022-08-07 DIAGNOSIS — M9902 Segmental and somatic dysfunction of thoracic region: Secondary | ICD-10-CM | POA: Diagnosis not present

## 2022-08-07 DIAGNOSIS — M9906 Segmental and somatic dysfunction of lower extremity: Secondary | ICD-10-CM | POA: Diagnosis not present

## 2022-08-10 DIAGNOSIS — M9906 Segmental and somatic dysfunction of lower extremity: Secondary | ICD-10-CM | POA: Diagnosis not present

## 2022-08-10 DIAGNOSIS — M9903 Segmental and somatic dysfunction of lumbar region: Secondary | ICD-10-CM | POA: Diagnosis not present

## 2022-08-10 DIAGNOSIS — M9902 Segmental and somatic dysfunction of thoracic region: Secondary | ICD-10-CM | POA: Diagnosis not present

## 2022-08-10 DIAGNOSIS — M62830 Muscle spasm of back: Secondary | ICD-10-CM | POA: Diagnosis not present

## 2022-08-10 DIAGNOSIS — M9905 Segmental and somatic dysfunction of pelvic region: Secondary | ICD-10-CM | POA: Diagnosis not present

## 2022-08-22 DIAGNOSIS — M9902 Segmental and somatic dysfunction of thoracic region: Secondary | ICD-10-CM | POA: Diagnosis not present

## 2022-08-22 DIAGNOSIS — M9903 Segmental and somatic dysfunction of lumbar region: Secondary | ICD-10-CM | POA: Diagnosis not present

## 2022-08-22 DIAGNOSIS — M9905 Segmental and somatic dysfunction of pelvic region: Secondary | ICD-10-CM | POA: Diagnosis not present

## 2022-08-22 DIAGNOSIS — M9906 Segmental and somatic dysfunction of lower extremity: Secondary | ICD-10-CM | POA: Diagnosis not present

## 2022-08-22 DIAGNOSIS — M62830 Muscle spasm of back: Secondary | ICD-10-CM | POA: Diagnosis not present

## 2022-08-24 DIAGNOSIS — M9905 Segmental and somatic dysfunction of pelvic region: Secondary | ICD-10-CM | POA: Diagnosis not present

## 2022-08-24 DIAGNOSIS — M9902 Segmental and somatic dysfunction of thoracic region: Secondary | ICD-10-CM | POA: Diagnosis not present

## 2022-08-24 DIAGNOSIS — M9906 Segmental and somatic dysfunction of lower extremity: Secondary | ICD-10-CM | POA: Diagnosis not present

## 2022-08-24 DIAGNOSIS — M9903 Segmental and somatic dysfunction of lumbar region: Secondary | ICD-10-CM | POA: Diagnosis not present

## 2022-08-24 DIAGNOSIS — M62830 Muscle spasm of back: Secondary | ICD-10-CM | POA: Diagnosis not present

## 2022-08-28 DIAGNOSIS — M9902 Segmental and somatic dysfunction of thoracic region: Secondary | ICD-10-CM | POA: Diagnosis not present

## 2022-08-28 DIAGNOSIS — M62830 Muscle spasm of back: Secondary | ICD-10-CM | POA: Diagnosis not present

## 2022-08-28 DIAGNOSIS — M9906 Segmental and somatic dysfunction of lower extremity: Secondary | ICD-10-CM | POA: Diagnosis not present

## 2022-08-28 DIAGNOSIS — M9903 Segmental and somatic dysfunction of lumbar region: Secondary | ICD-10-CM | POA: Diagnosis not present

## 2022-08-28 DIAGNOSIS — M9905 Segmental and somatic dysfunction of pelvic region: Secondary | ICD-10-CM | POA: Diagnosis not present

## 2022-08-30 DIAGNOSIS — M9905 Segmental and somatic dysfunction of pelvic region: Secondary | ICD-10-CM | POA: Diagnosis not present

## 2022-08-30 DIAGNOSIS — M9906 Segmental and somatic dysfunction of lower extremity: Secondary | ICD-10-CM | POA: Diagnosis not present

## 2022-08-30 DIAGNOSIS — M9903 Segmental and somatic dysfunction of lumbar region: Secondary | ICD-10-CM | POA: Diagnosis not present

## 2022-08-30 DIAGNOSIS — M62830 Muscle spasm of back: Secondary | ICD-10-CM | POA: Diagnosis not present

## 2022-08-30 DIAGNOSIS — M9902 Segmental and somatic dysfunction of thoracic region: Secondary | ICD-10-CM | POA: Diagnosis not present

## 2022-10-23 DIAGNOSIS — M9906 Segmental and somatic dysfunction of lower extremity: Secondary | ICD-10-CM | POA: Diagnosis not present

## 2022-10-23 DIAGNOSIS — M9905 Segmental and somatic dysfunction of pelvic region: Secondary | ICD-10-CM | POA: Diagnosis not present

## 2022-10-23 DIAGNOSIS — M9903 Segmental and somatic dysfunction of lumbar region: Secondary | ICD-10-CM | POA: Diagnosis not present

## 2022-10-23 DIAGNOSIS — M9902 Segmental and somatic dysfunction of thoracic region: Secondary | ICD-10-CM | POA: Diagnosis not present

## 2022-10-23 DIAGNOSIS — M62830 Muscle spasm of back: Secondary | ICD-10-CM | POA: Diagnosis not present

## 2022-11-14 DIAGNOSIS — L719 Rosacea, unspecified: Secondary | ICD-10-CM | POA: Diagnosis not present

## 2022-11-30 DIAGNOSIS — M62830 Muscle spasm of back: Secondary | ICD-10-CM | POA: Diagnosis not present

## 2022-11-30 DIAGNOSIS — M9906 Segmental and somatic dysfunction of lower extremity: Secondary | ICD-10-CM | POA: Diagnosis not present

## 2022-11-30 DIAGNOSIS — M9903 Segmental and somatic dysfunction of lumbar region: Secondary | ICD-10-CM | POA: Diagnosis not present

## 2022-11-30 DIAGNOSIS — M9905 Segmental and somatic dysfunction of pelvic region: Secondary | ICD-10-CM | POA: Diagnosis not present

## 2022-11-30 DIAGNOSIS — M9902 Segmental and somatic dysfunction of thoracic region: Secondary | ICD-10-CM | POA: Diagnosis not present

## 2022-12-04 DIAGNOSIS — M9906 Segmental and somatic dysfunction of lower extremity: Secondary | ICD-10-CM | POA: Diagnosis not present

## 2022-12-04 DIAGNOSIS — M9903 Segmental and somatic dysfunction of lumbar region: Secondary | ICD-10-CM | POA: Diagnosis not present

## 2022-12-04 DIAGNOSIS — M9902 Segmental and somatic dysfunction of thoracic region: Secondary | ICD-10-CM | POA: Diagnosis not present

## 2022-12-04 DIAGNOSIS — M62830 Muscle spasm of back: Secondary | ICD-10-CM | POA: Diagnosis not present

## 2022-12-04 DIAGNOSIS — M9905 Segmental and somatic dysfunction of pelvic region: Secondary | ICD-10-CM | POA: Diagnosis not present

## 2022-12-06 DIAGNOSIS — M9902 Segmental and somatic dysfunction of thoracic region: Secondary | ICD-10-CM | POA: Diagnosis not present

## 2022-12-06 DIAGNOSIS — M9903 Segmental and somatic dysfunction of lumbar region: Secondary | ICD-10-CM | POA: Diagnosis not present

## 2022-12-06 DIAGNOSIS — M9905 Segmental and somatic dysfunction of pelvic region: Secondary | ICD-10-CM | POA: Diagnosis not present

## 2022-12-06 DIAGNOSIS — M9906 Segmental and somatic dysfunction of lower extremity: Secondary | ICD-10-CM | POA: Diagnosis not present

## 2022-12-06 DIAGNOSIS — M62830 Muscle spasm of back: Secondary | ICD-10-CM | POA: Diagnosis not present

## 2022-12-12 DIAGNOSIS — M62830 Muscle spasm of back: Secondary | ICD-10-CM | POA: Diagnosis not present

## 2022-12-12 DIAGNOSIS — M9905 Segmental and somatic dysfunction of pelvic region: Secondary | ICD-10-CM | POA: Diagnosis not present

## 2022-12-12 DIAGNOSIS — M9902 Segmental and somatic dysfunction of thoracic region: Secondary | ICD-10-CM | POA: Diagnosis not present

## 2022-12-12 DIAGNOSIS — M9906 Segmental and somatic dysfunction of lower extremity: Secondary | ICD-10-CM | POA: Diagnosis not present

## 2022-12-12 DIAGNOSIS — M9903 Segmental and somatic dysfunction of lumbar region: Secondary | ICD-10-CM | POA: Diagnosis not present

## 2022-12-22 DIAGNOSIS — M9903 Segmental and somatic dysfunction of lumbar region: Secondary | ICD-10-CM | POA: Diagnosis not present

## 2022-12-22 DIAGNOSIS — M9902 Segmental and somatic dysfunction of thoracic region: Secondary | ICD-10-CM | POA: Diagnosis not present

## 2022-12-22 DIAGNOSIS — M9906 Segmental and somatic dysfunction of lower extremity: Secondary | ICD-10-CM | POA: Diagnosis not present

## 2022-12-22 DIAGNOSIS — M62830 Muscle spasm of back: Secondary | ICD-10-CM | POA: Diagnosis not present

## 2022-12-22 DIAGNOSIS — M9905 Segmental and somatic dysfunction of pelvic region: Secondary | ICD-10-CM | POA: Diagnosis not present

## 2022-12-29 DIAGNOSIS — M9905 Segmental and somatic dysfunction of pelvic region: Secondary | ICD-10-CM | POA: Diagnosis not present

## 2022-12-29 DIAGNOSIS — M9902 Segmental and somatic dysfunction of thoracic region: Secondary | ICD-10-CM | POA: Diagnosis not present

## 2022-12-29 DIAGNOSIS — M9906 Segmental and somatic dysfunction of lower extremity: Secondary | ICD-10-CM | POA: Diagnosis not present

## 2022-12-29 DIAGNOSIS — M62830 Muscle spasm of back: Secondary | ICD-10-CM | POA: Diagnosis not present

## 2022-12-29 DIAGNOSIS — M9903 Segmental and somatic dysfunction of lumbar region: Secondary | ICD-10-CM | POA: Diagnosis not present

## 2023-01-04 DIAGNOSIS — M62830 Muscle spasm of back: Secondary | ICD-10-CM | POA: Diagnosis not present

## 2023-01-04 DIAGNOSIS — M9902 Segmental and somatic dysfunction of thoracic region: Secondary | ICD-10-CM | POA: Diagnosis not present

## 2023-01-04 DIAGNOSIS — M9906 Segmental and somatic dysfunction of lower extremity: Secondary | ICD-10-CM | POA: Diagnosis not present

## 2023-01-04 DIAGNOSIS — M9903 Segmental and somatic dysfunction of lumbar region: Secondary | ICD-10-CM | POA: Diagnosis not present

## 2023-01-04 DIAGNOSIS — M9905 Segmental and somatic dysfunction of pelvic region: Secondary | ICD-10-CM | POA: Diagnosis not present

## 2023-01-09 DIAGNOSIS — M9902 Segmental and somatic dysfunction of thoracic region: Secondary | ICD-10-CM | POA: Diagnosis not present

## 2023-01-09 DIAGNOSIS — M9906 Segmental and somatic dysfunction of lower extremity: Secondary | ICD-10-CM | POA: Diagnosis not present

## 2023-01-09 DIAGNOSIS — M9903 Segmental and somatic dysfunction of lumbar region: Secondary | ICD-10-CM | POA: Diagnosis not present

## 2023-01-09 DIAGNOSIS — M9905 Segmental and somatic dysfunction of pelvic region: Secondary | ICD-10-CM | POA: Diagnosis not present

## 2023-01-09 DIAGNOSIS — M62830 Muscle spasm of back: Secondary | ICD-10-CM | POA: Diagnosis not present

## 2023-01-13 DIAGNOSIS — M9903 Segmental and somatic dysfunction of lumbar region: Secondary | ICD-10-CM | POA: Diagnosis not present

## 2023-01-13 DIAGNOSIS — M9905 Segmental and somatic dysfunction of pelvic region: Secondary | ICD-10-CM | POA: Diagnosis not present

## 2023-01-13 DIAGNOSIS — M9902 Segmental and somatic dysfunction of thoracic region: Secondary | ICD-10-CM | POA: Diagnosis not present

## 2023-01-13 DIAGNOSIS — M62830 Muscle spasm of back: Secondary | ICD-10-CM | POA: Diagnosis not present

## 2023-01-13 DIAGNOSIS — M9906 Segmental and somatic dysfunction of lower extremity: Secondary | ICD-10-CM | POA: Diagnosis not present

## 2023-01-15 DIAGNOSIS — M9903 Segmental and somatic dysfunction of lumbar region: Secondary | ICD-10-CM | POA: Diagnosis not present

## 2023-01-15 DIAGNOSIS — M9902 Segmental and somatic dysfunction of thoracic region: Secondary | ICD-10-CM | POA: Diagnosis not present

## 2023-01-15 DIAGNOSIS — M9906 Segmental and somatic dysfunction of lower extremity: Secondary | ICD-10-CM | POA: Diagnosis not present

## 2023-01-15 DIAGNOSIS — M9905 Segmental and somatic dysfunction of pelvic region: Secondary | ICD-10-CM | POA: Diagnosis not present

## 2023-01-15 DIAGNOSIS — M62830 Muscle spasm of back: Secondary | ICD-10-CM | POA: Diagnosis not present

## 2023-01-23 DIAGNOSIS — M9902 Segmental and somatic dysfunction of thoracic region: Secondary | ICD-10-CM | POA: Diagnosis not present

## 2023-01-23 DIAGNOSIS — M9906 Segmental and somatic dysfunction of lower extremity: Secondary | ICD-10-CM | POA: Diagnosis not present

## 2023-01-23 DIAGNOSIS — M9903 Segmental and somatic dysfunction of lumbar region: Secondary | ICD-10-CM | POA: Diagnosis not present

## 2023-01-23 DIAGNOSIS — M62830 Muscle spasm of back: Secondary | ICD-10-CM | POA: Diagnosis not present

## 2023-01-23 DIAGNOSIS — M9905 Segmental and somatic dysfunction of pelvic region: Secondary | ICD-10-CM | POA: Diagnosis not present

## 2023-02-26 ENCOUNTER — Other Ambulatory Visit: Payer: Self-pay | Admitting: Obstetrics and Gynecology

## 2023-02-26 ENCOUNTER — Other Ambulatory Visit: Payer: Self-pay

## 2023-02-26 DIAGNOSIS — Z1231 Encounter for screening mammogram for malignant neoplasm of breast: Secondary | ICD-10-CM

## 2023-03-06 ENCOUNTER — Ambulatory Visit: Payer: BC Managed Care – PPO

## 2023-04-12 ENCOUNTER — Ambulatory Visit
Admission: RE | Admit: 2023-04-12 | Discharge: 2023-04-12 | Disposition: A | Discharge status: Home / Self Care | Payer: BC Managed Care – PPO | Source: Ambulatory Visit | Attending: Obstetrics and Gynecology | Admitting: Obstetrics and Gynecology

## 2023-04-12 DIAGNOSIS — Z1231 Encounter for screening mammogram for malignant neoplasm of breast: Secondary | ICD-10-CM | POA: Diagnosis not present

## 2023-07-04 DIAGNOSIS — Z6824 Body mass index (BMI) 24.0-24.9, adult: Secondary | ICD-10-CM | POA: Diagnosis not present

## 2023-07-04 DIAGNOSIS — Z01419 Encounter for gynecological examination (general) (routine) without abnormal findings: Secondary | ICD-10-CM | POA: Diagnosis not present

## 2023-08-01 DIAGNOSIS — L578 Other skin changes due to chronic exposure to nonionizing radiation: Secondary | ICD-10-CM | POA: Diagnosis not present

## 2023-08-01 DIAGNOSIS — L719 Rosacea, unspecified: Secondary | ICD-10-CM | POA: Diagnosis not present

## 2023-08-01 DIAGNOSIS — D2372 Other benign neoplasm of skin of left lower limb, including hip: Secondary | ICD-10-CM | POA: Diagnosis not present

## 2023-08-01 DIAGNOSIS — D225 Melanocytic nevi of trunk: Secondary | ICD-10-CM | POA: Diagnosis not present

## 2023-09-13 DIAGNOSIS — M9905 Segmental and somatic dysfunction of pelvic region: Secondary | ICD-10-CM | POA: Diagnosis not present

## 2023-09-13 DIAGNOSIS — M62830 Muscle spasm of back: Secondary | ICD-10-CM | POA: Diagnosis not present

## 2023-09-13 DIAGNOSIS — M9903 Segmental and somatic dysfunction of lumbar region: Secondary | ICD-10-CM | POA: Diagnosis not present

## 2023-09-13 DIAGNOSIS — M9906 Segmental and somatic dysfunction of lower extremity: Secondary | ICD-10-CM | POA: Diagnosis not present

## 2023-09-13 DIAGNOSIS — M9902 Segmental and somatic dysfunction of thoracic region: Secondary | ICD-10-CM | POA: Diagnosis not present

## 2023-09-20 DIAGNOSIS — M9902 Segmental and somatic dysfunction of thoracic region: Secondary | ICD-10-CM | POA: Diagnosis not present

## 2023-09-20 DIAGNOSIS — M9903 Segmental and somatic dysfunction of lumbar region: Secondary | ICD-10-CM | POA: Diagnosis not present

## 2023-09-20 DIAGNOSIS — M9905 Segmental and somatic dysfunction of pelvic region: Secondary | ICD-10-CM | POA: Diagnosis not present

## 2023-09-20 DIAGNOSIS — M9906 Segmental and somatic dysfunction of lower extremity: Secondary | ICD-10-CM | POA: Diagnosis not present

## 2023-09-20 DIAGNOSIS — M62830 Muscle spasm of back: Secondary | ICD-10-CM | POA: Diagnosis not present

## 2023-09-25 DIAGNOSIS — M9906 Segmental and somatic dysfunction of lower extremity: Secondary | ICD-10-CM | POA: Diagnosis not present

## 2023-09-25 DIAGNOSIS — M9902 Segmental and somatic dysfunction of thoracic region: Secondary | ICD-10-CM | POA: Diagnosis not present

## 2023-09-25 DIAGNOSIS — M9905 Segmental and somatic dysfunction of pelvic region: Secondary | ICD-10-CM | POA: Diagnosis not present

## 2023-09-25 DIAGNOSIS — M62830 Muscle spasm of back: Secondary | ICD-10-CM | POA: Diagnosis not present

## 2023-09-25 DIAGNOSIS — M9903 Segmental and somatic dysfunction of lumbar region: Secondary | ICD-10-CM | POA: Diagnosis not present

## 2023-10-22 IMAGING — MG MM DIGITAL SCREENING BILAT W/ TOMO AND CAD
6 of 10 series · 6 of 30 positions shown · non-contrast
Comparison: Previous exam(s).

CLINICAL DATA: Screening.

EXAM:
DIGITAL SCREENING BILATERAL MAMMOGRAM WITH TOMOSYNTHESIS AND CAD
TECHNIQUE: Bilateral screening digital craniocaudal and mediolateral oblique
mammograms were obtained. Bilateral screening digital breast
tomosynthesis was performed. The images were evaluated with
computer-aided detection.

[R MLO synth-2D (1 of 2)]
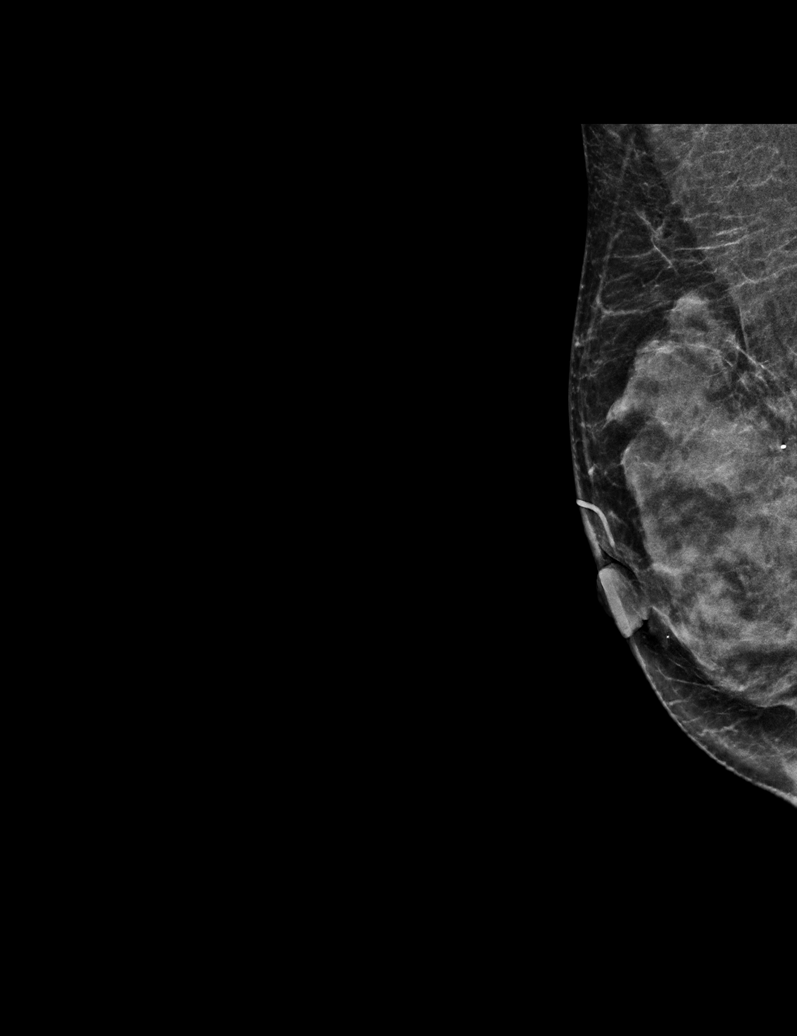

[R CC synth-2D]
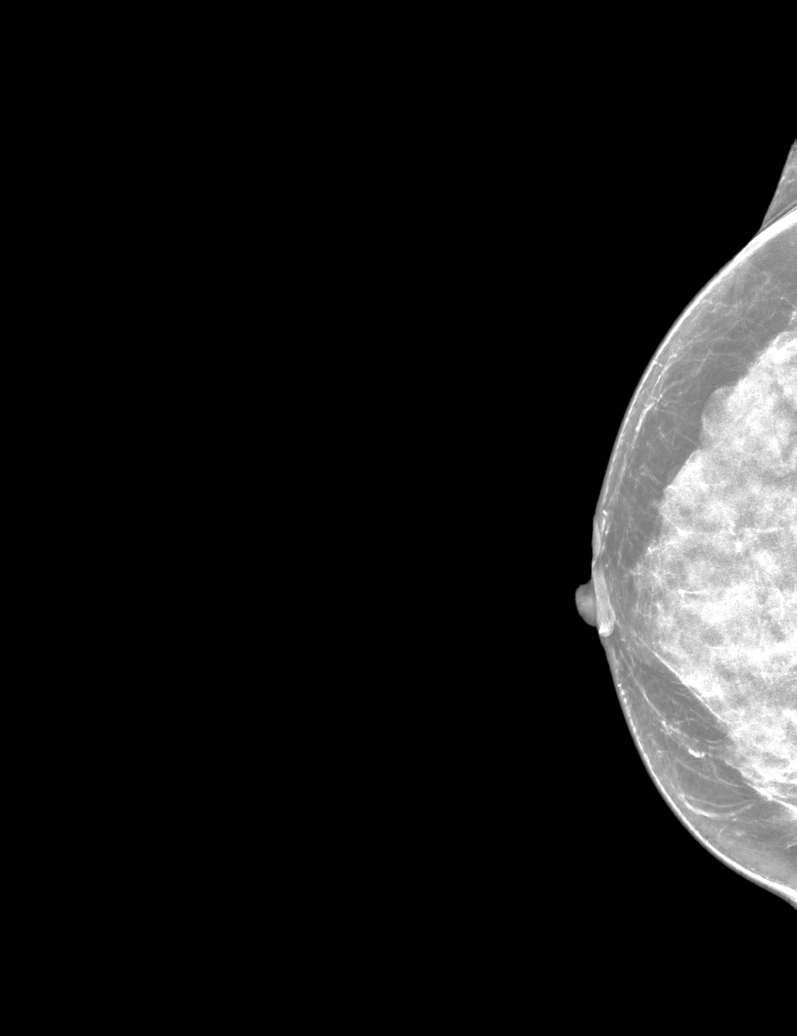

[L MLO synth-2D]
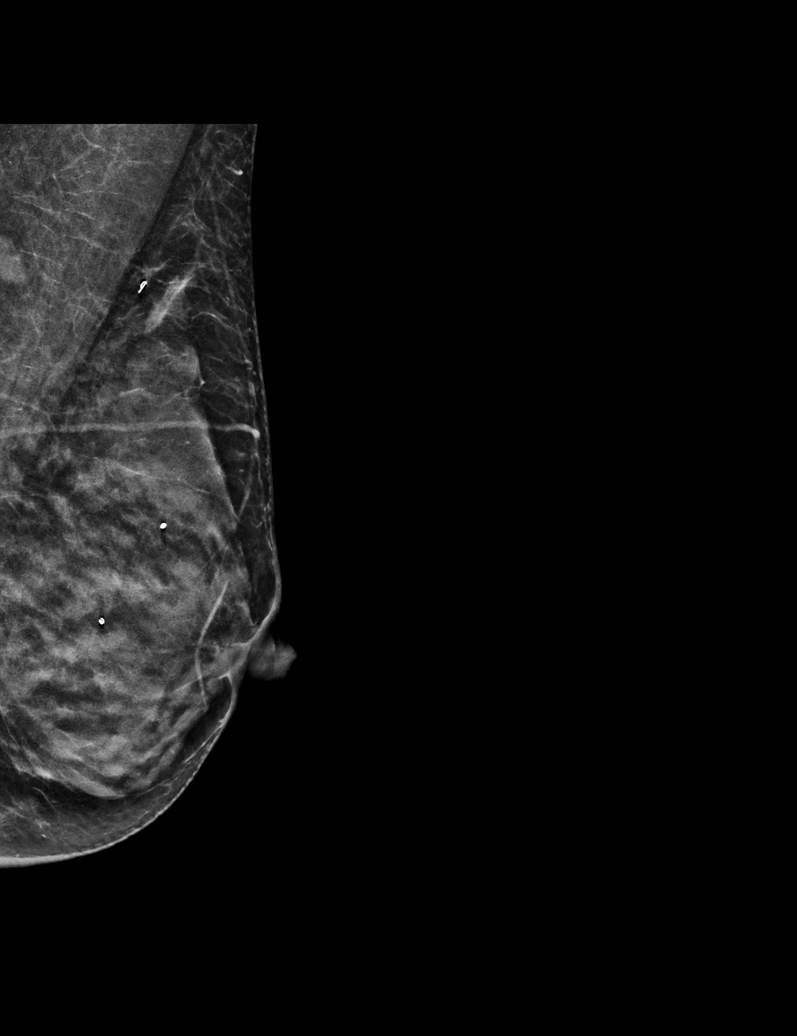

[L CC synth-2D]
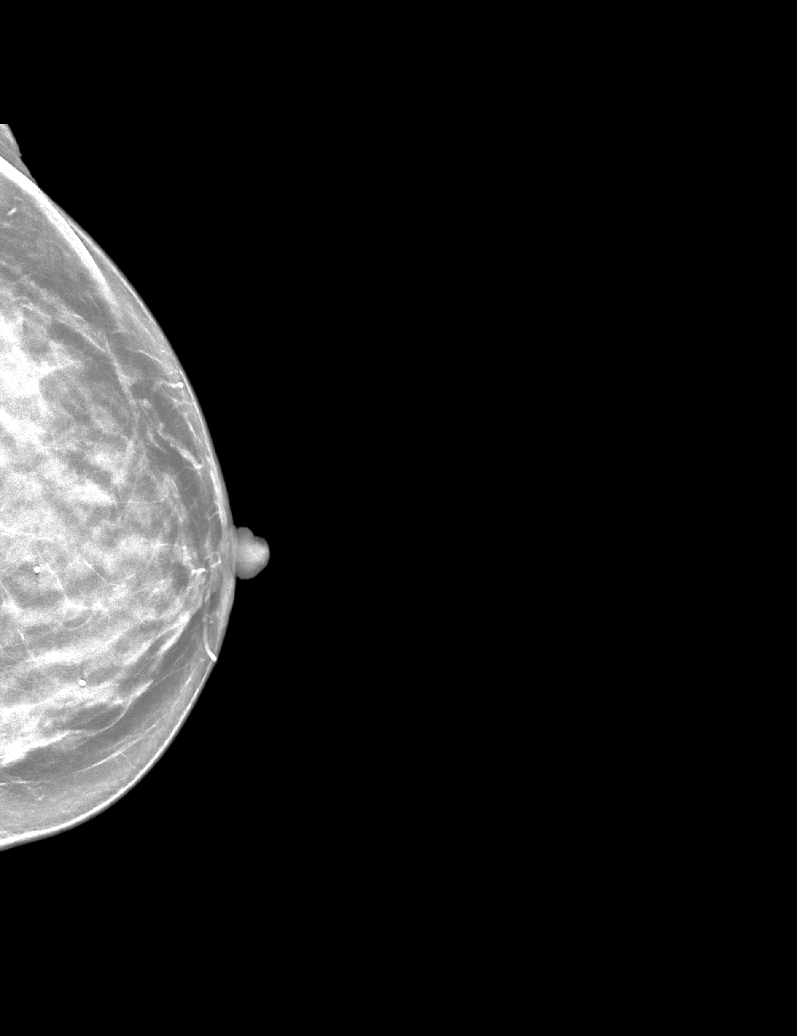

[R MLO synth-2D (2 of 2)]
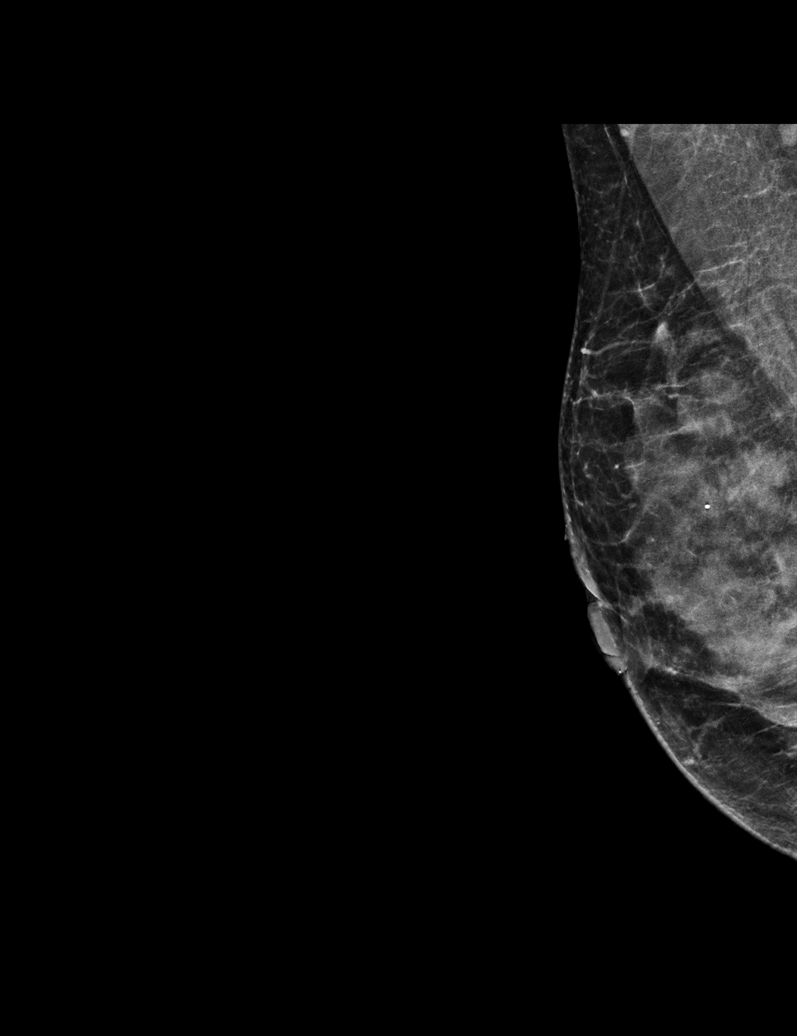

[R CC tomo · tomo slice 26/51.0]
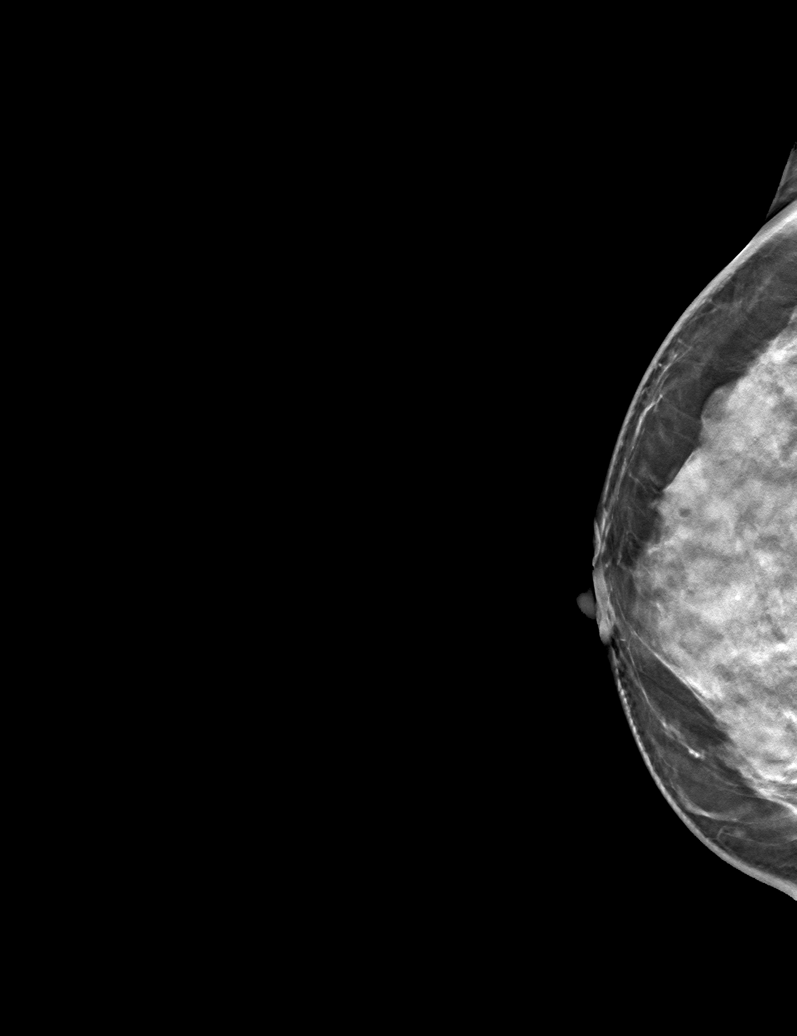

[6 of 30 positions shown; findings below may reference images not displayed]

ACR Breast Density Category d: The breast tissue is extremely dense,
which lowers the sensitivity of mammography.
FINDINGS: In the right breast, a possible mass warrants further evaluation. In
the left breast, no findings suspicious for malignancy.
IMPRESSION: Further evaluation is suggested for a possible mass in the right
breast.

RECOMMENDATION:
Diagnostic mammogram and possibly ultrasound of the right breast.
(Code:[id number])

The patient will be contacted regarding the findings, and additional
imaging will be scheduled.

BI-RADS CATEGORY  0: Incomplete. Need additional imaging evaluation
and/or prior mammograms for comparison.

## 2024-07-21 ENCOUNTER — Encounter: Payer: Self-pay | Admitting: Family Medicine

## 2024-07-21 ENCOUNTER — Other Ambulatory Visit: Payer: Self-pay | Admitting: Family Medicine

## 2024-07-21 ENCOUNTER — Ambulatory Visit
Admission: RE | Admit: 2024-07-21 | Discharge: 2024-07-21 | Disposition: A | Discharge status: Home / Self Care | Source: Ambulatory Visit | Attending: Family Medicine | Admitting: Family Medicine

## 2024-07-21 DIAGNOSIS — M25552 Pain in left hip: Secondary | ICD-10-CM

## 2024-07-21 DIAGNOSIS — M25551 Pain in right hip: Secondary | ICD-10-CM

## 2024-08-02 ENCOUNTER — Ambulatory Visit
Admission: RE | Admit: 2024-08-02 | Discharge: 2024-08-02 | Disposition: A | Source: Ambulatory Visit | Attending: Family Medicine | Admitting: Family Medicine

## 2024-08-02 DIAGNOSIS — M25551 Pain in right hip: Secondary | ICD-10-CM

## 2024-08-02 DIAGNOSIS — M25552 Pain in left hip: Secondary | ICD-10-CM
